# Patient Record
Sex: Female | Born: 1955 | Race: Black or African American | Hispanic: No | Marital: Married | Smoking: Former smoker
Health system: Southern US, Community
[De-identification: ages and names within clinical notes are randomized; demographics above are authoritative.]

## PROBLEM LIST (undated history)

## (undated) DIAGNOSIS — J8489 Other specified interstitial pulmonary diseases: Secondary | ICD-10-CM

## (undated) DIAGNOSIS — M329 Systemic lupus erythematosus, unspecified: Secondary | ICD-10-CM

## (undated) DIAGNOSIS — J449 Chronic obstructive pulmonary disease, unspecified: Secondary | ICD-10-CM

## (undated) DIAGNOSIS — I10 Essential (primary) hypertension: Secondary | ICD-10-CM

## (undated) DIAGNOSIS — E039 Hypothyroidism, unspecified: Secondary | ICD-10-CM

## (undated) DIAGNOSIS — N6001 Solitary cyst of right breast: Secondary | ICD-10-CM

## (undated) DIAGNOSIS — N6002 Solitary cyst of left breast: Secondary | ICD-10-CM

## (undated) DIAGNOSIS — I2510 Atherosclerotic heart disease of native coronary artery without angina pectoris: Secondary | ICD-10-CM

## (undated) DIAGNOSIS — M1990 Unspecified osteoarthritis, unspecified site: Secondary | ICD-10-CM

## (undated) HISTORY — PX: BREAST SURGERY: SHX581

## (undated) HISTORY — DX: Solitary cyst of right breast: N60.02

## (undated) HISTORY — DX: Solitary cyst of right breast: N60.01

## (undated) HISTORY — DX: Essential (primary) hypertension: I10

## (undated) HISTORY — DX: Other specified interstitial pulmonary diseases: J84.89

## (undated) HISTORY — PX: CARPAL TUNNEL RELEASE: SHX101

## (undated) HISTORY — DX: Systemic lupus erythematosus, unspecified: M32.9

## (undated) HISTORY — PX: TRIGGER FINGER RELEASE: SHX641

## (undated) HISTORY — DX: Atherosclerotic heart disease of native coronary artery without angina pectoris: I25.10

## (undated) HISTORY — PX: ABDOMINAL HYSTERECTOMY: SHX81

## (undated) HISTORY — PX: APPENDECTOMY: SHX54

## (undated) HISTORY — DX: Hypothyroidism, unspecified: E03.9

---

## 2001-09-10 ENCOUNTER — Ambulatory Visit (HOSPITAL_COMMUNITY): Admission: RE | Admit: 2001-09-10 | Discharge: 2001-09-10 | Payer: Self-pay | Admitting: *Deleted

## 2001-09-10 ENCOUNTER — Encounter: Payer: Self-pay | Admitting: *Deleted

## 2001-09-26 ENCOUNTER — Ambulatory Visit (HOSPITAL_COMMUNITY): Admission: RE | Admit: 2001-09-26 | Discharge: 2001-09-26 | Payer: Self-pay | Admitting: Family Medicine

## 2001-09-26 ENCOUNTER — Encounter: Payer: Self-pay | Admitting: *Deleted

## 2002-06-17 ENCOUNTER — Emergency Department (HOSPITAL_COMMUNITY): Admission: EM | Admit: 2002-06-17 | Discharge: 2002-06-17 | Payer: Self-pay | Admitting: *Deleted

## 2002-09-14 ENCOUNTER — Ambulatory Visit (HOSPITAL_COMMUNITY): Admission: RE | Admit: 2002-09-14 | Discharge: 2002-09-14 | Payer: Self-pay | Admitting: *Deleted

## 2002-09-14 ENCOUNTER — Encounter: Payer: Self-pay | Admitting: *Deleted

## 2003-01-05 ENCOUNTER — Inpatient Hospital Stay (HOSPITAL_COMMUNITY): Admission: RE | Admit: 2003-01-05 | Discharge: 2003-01-07 | Payer: Self-pay | Admitting: Obstetrics & Gynecology

## 2003-01-16 ENCOUNTER — Emergency Department (HOSPITAL_COMMUNITY): Admission: EM | Admit: 2003-01-16 | Discharge: 2003-01-16 | Payer: Self-pay | Admitting: Internal Medicine

## 2003-09-15 ENCOUNTER — Ambulatory Visit (HOSPITAL_COMMUNITY): Admission: RE | Admit: 2003-09-15 | Discharge: 2003-09-15 | Payer: Self-pay | Admitting: Family Medicine

## 2004-10-20 ENCOUNTER — Ambulatory Visit (HOSPITAL_COMMUNITY): Admission: RE | Admit: 2004-10-20 | Discharge: 2004-10-20 | Payer: Self-pay | Admitting: Family Medicine

## 2004-11-13 ENCOUNTER — Ambulatory Visit: Payer: Self-pay | Admitting: *Deleted

## 2005-03-26 ENCOUNTER — Ambulatory Visit (HOSPITAL_COMMUNITY): Admission: RE | Admit: 2005-03-26 | Discharge: 2005-03-26 | Payer: Self-pay | Admitting: Obstetrics & Gynecology

## 2005-05-01 ENCOUNTER — Encounter (HOSPITAL_COMMUNITY): Admission: RE | Admit: 2005-05-01 | Discharge: 2005-05-31 | Payer: Self-pay | Admitting: Endocrinology

## 2005-10-17 ENCOUNTER — Ambulatory Visit (HOSPITAL_COMMUNITY): Admission: RE | Admit: 2005-10-17 | Discharge: 2005-10-17 | Payer: Self-pay | Admitting: Family Medicine

## 2005-10-22 ENCOUNTER — Ambulatory Visit (HOSPITAL_COMMUNITY): Admission: RE | Admit: 2005-10-22 | Discharge: 2005-10-22 | Payer: Self-pay | Admitting: Family Medicine

## 2005-11-13 ENCOUNTER — Inpatient Hospital Stay (HOSPITAL_COMMUNITY): Admission: EM | Admit: 2005-11-13 | Discharge: 2005-11-22 | Payer: Self-pay | Admitting: *Deleted

## 2005-11-13 ENCOUNTER — Ambulatory Visit: Payer: Self-pay | Admitting: *Deleted

## 2005-11-13 ENCOUNTER — Encounter: Payer: Self-pay | Admitting: Internal Medicine

## 2005-11-14 ENCOUNTER — Encounter: Payer: Self-pay | Admitting: Cardiology

## 2005-11-14 ENCOUNTER — Ambulatory Visit: Payer: Self-pay | Admitting: Pulmonary Disease

## 2005-12-06 ENCOUNTER — Ambulatory Visit: Payer: Self-pay | Admitting: Cardiology

## 2006-10-24 ENCOUNTER — Ambulatory Visit (HOSPITAL_COMMUNITY): Admission: RE | Admit: 2006-10-24 | Discharge: 2006-10-24 | Payer: Self-pay | Admitting: Family Medicine

## 2006-11-06 ENCOUNTER — Encounter: Payer: Self-pay | Admitting: Orthopedic Surgery

## 2006-11-23 ENCOUNTER — Encounter: Payer: Self-pay | Admitting: Orthopedic Surgery

## 2006-12-24 ENCOUNTER — Encounter: Payer: Self-pay | Admitting: Orthopedic Surgery

## 2007-06-11 ENCOUNTER — Ambulatory Visit (HOSPITAL_COMMUNITY): Admission: RE | Admit: 2007-06-11 | Discharge: 2007-06-11 | Payer: Self-pay | Admitting: Obstetrics & Gynecology

## 2007-06-16 ENCOUNTER — Ambulatory Visit: Payer: Self-pay | Admitting: Internal Medicine

## 2007-07-07 ENCOUNTER — Ambulatory Visit (HOSPITAL_COMMUNITY): Admission: RE | Admit: 2007-07-07 | Discharge: 2007-07-07 | Payer: Self-pay | Admitting: Internal Medicine

## 2007-07-07 ENCOUNTER — Encounter: Payer: Self-pay | Admitting: Internal Medicine

## 2007-07-07 ENCOUNTER — Ambulatory Visit: Payer: Self-pay | Admitting: Internal Medicine

## 2007-11-03 ENCOUNTER — Ambulatory Visit (HOSPITAL_COMMUNITY): Admission: RE | Admit: 2007-11-03 | Discharge: 2007-11-03 | Payer: Self-pay | Admitting: Family Medicine

## 2008-08-01 ENCOUNTER — Emergency Department (HOSPITAL_COMMUNITY): Admission: EM | Admit: 2008-08-01 | Discharge: 2008-08-01 | Payer: Self-pay | Admitting: Emergency Medicine

## 2008-10-31 ENCOUNTER — Emergency Department (HOSPITAL_COMMUNITY): Admission: EM | Admit: 2008-10-31 | Discharge: 2008-10-31 | Payer: Self-pay | Admitting: Emergency Medicine

## 2008-11-19 ENCOUNTER — Ambulatory Visit (HOSPITAL_COMMUNITY): Admission: RE | Admit: 2008-11-19 | Discharge: 2008-11-19 | Payer: Self-pay | Admitting: Family Medicine

## 2009-03-19 ENCOUNTER — Emergency Department (HOSPITAL_COMMUNITY): Admission: EM | Admit: 2009-03-19 | Discharge: 2009-03-19 | Payer: Self-pay | Admitting: Emergency Medicine

## 2009-07-15 ENCOUNTER — Ambulatory Visit (HOSPITAL_COMMUNITY): Admission: RE | Admit: 2009-07-15 | Discharge: 2009-07-15 | Payer: Self-pay | Admitting: Cardiology

## 2009-07-15 ENCOUNTER — Encounter: Payer: Self-pay | Admitting: Cardiology

## 2009-07-15 ENCOUNTER — Ambulatory Visit: Payer: Self-pay | Admitting: Cardiology

## 2009-07-22 DIAGNOSIS — R0602 Shortness of breath: Secondary | ICD-10-CM

## 2009-07-26 ENCOUNTER — Encounter (INDEPENDENT_AMBULATORY_CARE_PROVIDER_SITE_OTHER): Payer: Self-pay | Admitting: *Deleted

## 2009-07-29 ENCOUNTER — Ambulatory Visit (HOSPITAL_COMMUNITY): Admission: RE | Admit: 2009-07-29 | Discharge: 2009-07-29 | Payer: Self-pay | Admitting: Cardiology

## 2009-07-29 ENCOUNTER — Ambulatory Visit: Payer: Self-pay | Admitting: Cardiology

## 2009-07-29 DIAGNOSIS — IMO0001 Reserved for inherently not codable concepts without codable children: Secondary | ICD-10-CM

## 2009-07-29 DIAGNOSIS — R9431 Abnormal electrocardiogram [ECG] [EKG]: Secondary | ICD-10-CM

## 2009-07-29 DIAGNOSIS — I10 Essential (primary) hypertension: Secondary | ICD-10-CM

## 2009-07-29 DIAGNOSIS — I2510 Atherosclerotic heart disease of native coronary artery without angina pectoris: Secondary | ICD-10-CM

## 2009-08-01 ENCOUNTER — Encounter: Payer: Self-pay | Admitting: Cardiology

## 2009-08-04 ENCOUNTER — Encounter: Payer: Self-pay | Admitting: Cardiology

## 2009-08-05 LAB — CONVERTED CEMR LAB
ALT: 17 units/L (ref 0–35)
AST: 16 units/L (ref 0–37)
Bilirubin, Direct: 0.1 mg/dL (ref 0.0–0.3)
Cholesterol: 189 mg/dL (ref 0–200)
Indirect Bilirubin: 0.3 mg/dL (ref 0.0–0.9)
Total Bilirubin: 0.4 mg/dL (ref 0.3–1.2)
Total CHOL/HDL Ratio: 4.1
Total Protein: 6.7 g/dL (ref 6.0–8.3)
VLDL: 20 mg/dL (ref 0–40)

## 2009-08-08 ENCOUNTER — Encounter: Payer: Self-pay | Admitting: Cardiology

## 2009-08-08 DIAGNOSIS — E782 Mixed hyperlipidemia: Secondary | ICD-10-CM

## 2009-08-09 ENCOUNTER — Encounter: Payer: Self-pay | Admitting: Cardiology

## 2009-08-09 ENCOUNTER — Encounter (HOSPITAL_COMMUNITY): Admission: RE | Admit: 2009-08-09 | Discharge: 2009-09-08 | Payer: Self-pay | Admitting: Cardiology

## 2009-08-09 ENCOUNTER — Ambulatory Visit: Payer: Self-pay | Admitting: Cardiology

## 2009-08-26 ENCOUNTER — Ambulatory Visit: Payer: Self-pay | Admitting: Cardiology

## 2009-12-09 ENCOUNTER — Ambulatory Visit (HOSPITAL_COMMUNITY): Admission: RE | Admit: 2009-12-09 | Discharge: 2009-12-09 | Payer: Self-pay | Admitting: Family Medicine

## 2010-02-01 ENCOUNTER — Ambulatory Visit: Payer: Self-pay | Admitting: Cardiology

## 2010-02-02 ENCOUNTER — Encounter: Payer: Self-pay | Admitting: Cardiology

## 2010-09-24 ENCOUNTER — Emergency Department (HOSPITAL_COMMUNITY)
Admission: EM | Admit: 2010-09-24 | Discharge: 2010-09-24 | Payer: Self-pay | Source: Home / Self Care | Admitting: Emergency Medicine

## 2010-10-24 NOTE — Letter (Signed)
Summary: EKG  5.6.11  EKG  5.6.11   Imported By: Faythe Ghee 02/02/2010 12:08:48  _____________________________________________________________________  External Attachment:    Type:   Image     Comment:   External Document

## 2010-10-24 NOTE — Letter (Signed)
Summary: PROGRESS NOTE [facility] 5.6.11  PROGRESS NOTE [facility] 5.6.11   Imported By: Faythe Ghee 02/02/2010 12:08:14  _____________________________________________________________________  External Attachment:    Type:   Image     Comment:   External Document

## 2010-10-24 NOTE — Assessment & Plan Note (Signed)
Summary: **6 MTH F/U PER CHECKOUT ON 08/26/09/TG   Visit Type:  Follow-up Primary Provider:  Dr. Reynolds Bowl   History of Present Illness: 55 year old woman presents for followup. She is being considered for elective right carpal tunnel release, scheduled for next Thursday. We are asked to see her from a preoperative perspective. She denies any problems with exertional chest pain or breathlessness. I reviewed her history, including a followup Lexiscan Myoview done in November 2010 which revealed no ischemia with an LVEF of 65%  She reports compliance with her medications, and blood pressure is well controlled today..  Current Medications (verified): 1)  Estradiol 1 Mg Tabs (Estradiol) .... Take 1 Tab Daily 2)  Lovastatin 40 Mg Tabs (Lovastatin) .... Take 1 Tablet By Mouth Once Daily 3)  Advair Hfa 230-21 Mcg/act Aero (Fluticasone-Salmeterol) .... Use Two Times A Day 4)  Levothyroxine Sodium 88 Mcg Tabs (Levothyroxine Sodium) .... Take 1 Tab Daily 5)  Vasotec 2.5 Mg Tabs (Enalapril Maleate) .... Take 1 Tab Daily 6)  Aspir-Low 81 Mg Tbec (Aspirin) .... 5take 1 Tab Daily 7)  Coreg 6.25 Mg Tabs (Carvedilol) .... Take 1 Tab Two Times A Day 8)  Imdur 30 Mg Xr24h-Tab (Isosorbide Mononitrate) .... Take 1/2 Tablet By Mouth Once Daily 9)  Meloxicam 7.5 Mg Tabs (Meloxicam) .... Take 1 Tab As Needed 10)  Plaquenil 200 Mg Tabs (Hydroxychloroquine Sulfate) .... Take 1 Tab Two Times A Day  Allergies (verified): 1)  ! Levaquin 2)  ! * Cipron 3)  ! Sulfa 4)  ! Thiazides 5)  ! * Loop Diurectics 6)  ! 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    Alcohol Use - no Regular Exercise - no  Clinical Review Panels:  Echocardiogram Echocardiogram  Study Conclusions    1. Left ventricle: The cavity size was normal. Wall thickness was      increased in a pattern of mild LVH. Systolic function was normal.      The estimated ejection fraction was in the range of 55% to 60%.      Wall motion was normal; there were no regional wall motion      abnormalities.   2. Right ventricle: The cavity size was at the upper limits of      normal. Wall thickness was borderline.      .   Impressions:    - Compared to the prior study of 11/14/05, there has been no     significant interval change. (07/15/2009)    Review of Systems  The patient denies fever, chest pain, syncope, dyspnea on exertion, peripheral edema, prolonged cough, melena, and hematochezia.         Otherwise reviewed and negative.  Vital Signs:  Patient profile:   55 year old female Weight:      182 pounds Pulse rate:   69 / minute BP sitting:   128 / 80  (right arm)  Vitals Entered By: Dreama Saa, CNA (Feb 01, 2010 2:55 PM)  Physical Exam  Additional Exam:  Overweight woman in no acute distress. HEENT: Conjunctiva and lids normal, oropharynx clear with poor dentition. Neck: Supple, no elevated jugular venous pressure, no carotid bruits, no thyromegaly. Lungs: Clear with diminished breath sounds, nonlabored, no wheezing. Cardiac:  Regular rate and rhythm, soft basal systolic murmur, normal second heart sound, no S3 gallop.  Abdomen: Soft, nontender, no hepatomegaly, no bruits. Extremities: Trace ankle edema. Distal pulses one plus. Skin: Warm and dry. Musculoskeletal: No kyphosis. Neuropsychiatric: Alert and oriented x3, affect somewhat flat.   Nuclear Study  Procedure date:  08/09/2009  Findings:      IMPRESSION: Overall normal Lexiscan Myoview as outlined.  Resting ST-segment abnormalities are significant, and chronically noted.  There is no diagnostic  accentuation in the ST-segment abnormalities following Lexiscan infusion, and no chest pain was reported.  Perfusion data show evidence of breast attenuation but no ischemia.  There is a normal left ventricular ejection fraction of 65% without focal wall motion abnomalities.  Impression & Recommendations:  Problem # 1:  PREOPERATIVE EXAMINATION (ICD-V72.84)  Symptomatically stable, specifically with no progressive shortness of breath or chest pain, with prior history of nonobstructive CAD at catheterization in 2007, followed more recently by a Lexiscan Myoview in November 2010 revealing no ischemia with LVEF of 65%. She is on a reasonable medical regimen. She should be able to proceed with elective right carpal tunnel release at an acceptable perioperative cardiovascular risk. No further testing indicated at this time.  Problem # 2:  HYPERLIPIDEMIA (ICD-272.4)  Patient continues on lovastatin. She should continue follow up with her primary care provider, aiming for an LDL control close to 70.  Her updated medication list for this problem includes:    Lovastatin 40 Mg Tabs (Lovastatin) .Marland Kitchen... Take 1 tablet by mouth once daily  Problem # 3:  CORONARY ATHEROSCLEROSIS NATIVE CORONARY ARTERY (ICD-414.01)  Nonobstructive based on cardiac catheterization 2007, with followup reassuring noninvasive testing in November 2010 as outlined. Medical therapy anticipated.  Her updated medication list for this problem includes:    Vasotec 2.5 Mg Tabs (Enalapril maleate) .Marland Kitchen... Take 1 tab daily    Aspir-low 81 Mg Tbec (Aspirin) .Marland Kitchen... 5take 1 tab daily    Coreg 6.25 Mg Tabs (Carvedilol) .Marland Kitchen... Take 1 tab two times a day    Imdur 30 Mg Xr24h-tab (Isosorbide mononitrate) .Marland Kitchen... Take 1/2 tablet by mouth once daily  Patient Instructions: 1)  Your physician recommends that you schedule a follow-up appointment in: 1 year  Appended Document: **6 MTH F/U PER CHECKOUT ON 08/26/09/TG faxed office note to [facility]  attention Gae Gallop surgical coordinator at [phone]

## 2010-11-22 ENCOUNTER — Emergency Department (HOSPITAL_COMMUNITY)
Admission: EM | Admit: 2010-11-22 | Discharge: 2010-11-23 | Disposition: A | Discharge status: Home / Self Care | Payer: PRIVATE HEALTH INSURANCE | Attending: Emergency Medicine | Admitting: Emergency Medicine

## 2010-11-22 DIAGNOSIS — Z79899 Other long term (current) drug therapy: Secondary | ICD-10-CM | POA: Insufficient documentation

## 2010-11-22 DIAGNOSIS — E039 Hypothyroidism, unspecified: Secondary | ICD-10-CM | POA: Insufficient documentation

## 2010-11-22 DIAGNOSIS — J449 Chronic obstructive pulmonary disease, unspecified: Secondary | ICD-10-CM | POA: Insufficient documentation

## 2010-11-22 DIAGNOSIS — I10 Essential (primary) hypertension: Secondary | ICD-10-CM | POA: Insufficient documentation

## 2010-11-22 DIAGNOSIS — J4489 Other specified chronic obstructive pulmonary disease: Secondary | ICD-10-CM | POA: Insufficient documentation

## 2010-11-22 DIAGNOSIS — Z7982 Long term (current) use of aspirin: Secondary | ICD-10-CM | POA: Insufficient documentation

## 2010-11-22 DIAGNOSIS — E7800 Pure hypercholesterolemia, unspecified: Secondary | ICD-10-CM | POA: Insufficient documentation

## 2010-11-22 DIAGNOSIS — R51 Headache: Secondary | ICD-10-CM | POA: Insufficient documentation

## 2010-11-23 ENCOUNTER — Emergency Department (HOSPITAL_COMMUNITY): Payer: PRIVATE HEALTH INSURANCE

## 2010-11-28 ENCOUNTER — Other Ambulatory Visit (HOSPITAL_COMMUNITY): Payer: Self-pay | Admitting: Family Medicine

## 2010-11-28 DIAGNOSIS — Z139 Encounter for screening, unspecified: Secondary | ICD-10-CM

## 2010-12-12 ENCOUNTER — Ambulatory Visit (HOSPITAL_COMMUNITY)
Admission: RE | Admit: 2010-12-12 | Discharge: 2010-12-12 | Disposition: A | Discharge status: Home / Self Care | Payer: PRIVATE HEALTH INSURANCE | Source: Ambulatory Visit | Attending: Family Medicine | Admitting: Family Medicine

## 2010-12-12 DIAGNOSIS — Z139 Encounter for screening, unspecified: Secondary | ICD-10-CM

## 2010-12-12 DIAGNOSIS — Z1231 Encounter for screening mammogram for malignant neoplasm of breast: Secondary | ICD-10-CM | POA: Insufficient documentation

## 2011-02-06 NOTE — Consult Note (Signed)
NAME:  Alicia Peters, Alicia Peters               ACCOUNT NO.:  [id number]   MEDICAL RECORD NO.:  [id number]          PATIENT TYPE:  AMB   LOCATION:  DAY                           FACILITY:  [facility]   PHYSICIAN:  R. Roetta Sessions, M.D. DATE OF BIRTH:  December 28, 1955   DATE OF CONSULTATION:  DATE OF DISCHARGE:                                 CONSULTATION   REQUESTING PHYSICIAN:  Dr. Despina Hidden.   REASON FOR CONSULTATION:  Chronic constipation.   HISTORY OF PRESENT ILLNESS:  Alicia Peters is a 55 year old African-  American female.  She has a history of chronic constipation.  She tells  me she has approximately two bowel movements per week.  She usually has  to drink prune juice to bring on her bowel movement.  She denies any  history of laxative use.  She has laxative use on a regular basis.  She  did try MiraLax for 1 month, which did not seem to help.  She has  increased the fiber in her diet but has not tried fiber supplementation.  She denies any rectal bleeding or melena.  She does have an intermittent  left lower quadrant, sharp and low abdominal pain which is sharp and  lasts a few seconds.  Most of her pain is relieved post defecation.  She  has never had a colonoscopy.  She has occasional heartburn and  indigestion, depending on which eats a couple of times per week.  She  has tried [employer] which seem to help.  She has never been on PPI.  She  denies any dysphagia or odynophagia, denies anorexia or early satiety.  She had her TSH checked recently, and she goes this week for results.  She is on Levoxyl.   PAST MEDICAL/SURGICAL HISTORY:  Hypertension, asthma,  hypercholesterolemia and hypothyroidism.  She had a benign left and  right breast cystectomy or cyst biopsies. She has had hand surgery and a  complete hysterectomy.   CURRENT MEDICATIONS:  1. Estradiol 2 mg daily.  2. Advair 250/50 mg b.i.d.  3. Aspirin 81 mg daily.  4. Isosorbide 30 mg daily.  5. Lovastatin 20 mg q.h.s.  6. Enalapril 2-1/2  mg b.i.d.  7. Carvedilol 6.25 mg b.i.d.  8. Levoxyl 88 mcg daily.   ALLERGIES:  SULFA, CIPRO, THIAZIDE, LOOP DIURETICS AND SULFA AND UREAS.   FAMILY HISTORY:  There is no known family history of colorectal  carcinoma or liver or chronic GI problems.   SOCIAL HISTORY:  Alicia Peters is married.  She has two children ages 38  and 30.  Both of them are healthy.  She is employed and works third  shifts in Designer, fashion/clothing.  She has a 40 pack-year history of tobacco use, quit  in 2007.  Denies any alcohol or drug use.   REVIEW OF SYSTEMS:  See HPI.  CONSTITUTIONAL:  Her weight has remained  stable.  Denies any fever or chills.  Otherwise negative.   PHYSICAL EXAM:  VITAL SIGNS:  Weight 181, height 54 inches,  temperature  97.9, blood pressure 140/90 and pulse 62.  GENERAL:  Alicia Peters is a well-developed,  well-nourished African-  American female in no acute distress.  HEENT.  Clear nonicteric, conjunctivae pink.  Oropharynx pink and moist  without any lesions.  NECK:  Supple without any masses or thyromegaly.  CHEST/HEART:  Regular rate and rhythm, normal S1, S2  without murmurs,  clicks, rubs or gallops.  LUNGS:  Clear auscultation bilaterally.  ABDOMEN:  Protuberant with positive bowel sounds x4.  No bruits  auscultated.  Soft, nontender, nondistended without palpable mass or  hepatosplenomegaly.  No rebound tenderness guarding.  RECTAL:  Deferred.  EXTREMITIES:  Without clubbing or edema bilaterally.  SKIN:  Brown, warm and dry, without rashes or jaundice.   IMPRESSION:  Ms. Pallett is a 55 year old African-American female who  will turn 51 next month.  At that time, she will need screening  colonoscopy.  She also has a history of chronic constipation for the  last 3 years, status post hysterectomy.  I feel she may benefit from  Amitiza, and we will go ahead and get her set up for screening  colonoscopy.   PLAN:  1. Constipation literature given for her review and standard       guidelines discussed today.  2. Colonoscopy with Dr. Jena Gauss in the near future.  Discussed      procedure, including risks and benefits included but not limited to      bleeding, infection, perforation, drug reaction, and agrees with      the plan.   PLAN:  1. No aspirin or __________  prior to the exam.  2. Suggested daily fiber supplementation of choice.  3. Increase her daily fluids.   We would like Dr. Despina Hidden for allowing [place] to participate in the care of Ms.  Peters.      Lorenza Burton, N.P.      Jonathon Bellows, M.D.  Electronically Signed    KJ/MEDQ  D:  06/16/2007  T:  06/16/2007  Job:  [id number]   cc:   Lazaro Arms, M.D.  Fax: [phone]

## 2011-02-06 NOTE — Op Note (Signed)
NAME:  Alicia Peters, Alicia Peters               ACCOUNT NO.:  [id number]   MEDICAL RECORD NO.:  [id number]          PATIENT TYPE:  AMB   LOCATION:  DAY                           FACILITY:  [facility]   PHYSICIAN:  R. Roetta Sessions, M.D. DATE OF BIRTH:  [birth year]   DATE OF PROCEDURE:  07/07/2007  DATE OF DISCHARGE:                               OPERATIVE REPORT   OPERATION/PROCEDURE:  Colonoscopy and biopsy.   INDICATIONS FOR PROCEDURE:  A 55 year old [place] American female with  chronic constipation refractory to MiraLax.  Has not had any rectal  bleeding.  Has never had her  GI tract imaged. No family history.  Colorectal placed.  Colonoscopy is now being done.  This approach has  been  discussed with the patient at length.  Potential risks and  benefits have been reviewed and questions answered.  She is agreeable.  Please see documentation in the medical record.   PROCEDURE NOTE:  Oxygen saturation, blood pressure, pulse, respirations,  and monitor throughout entire procedure.  Conscious sedation with Versed  3 mg IV, Demerol 75 mg IV in divided doses.  Instrument was Pentax video  chip system.   FINDINGS:  Digital rectal exam revealed no abnormalities.  The prep was  suboptimal to marginal.  There was some slimy coating of stool  interspersed throughout the entire colon which made exam more difficult  as it would not wash off very easily.  The colonic mucosa was examined  from the rectosigmoid junction through the left, transverse, right colon  to the appendiceal orifice, ileocecal valve, and cecum.  These  structures well seen and  photographed for the record.  From this level,  the scope was slowly withdrawn.  Previously mentioned mucosal surfaces  were again seen.  The patient had a cluster of 1-3 mm diminutive polyps  at the splenic flexure.  The remainder of the colonic mucosa appeared  normal although the poor prep compromised the exam.  These polyps were  cold biopsy/removed.  The scope  was pulled down to the rectum.  Thorough  exam of the remainder of the rectal mucosa and retroflex view of the  anal verge demonstrated no abnormalities.  The patient tolerated the  procedure well and was reactive after endoscopy.   IMPRESSION:  1. Normal rectum.  2. Diminutive polyps at the  splenic flexure cold biopsy/removed.  The      remainder of the colonic mucosa appeared normal.  3. Suboptimal prep compromised exam.  Very small lesion may not have      been seen because of obscuration with residual stool.   RECOMMENDATIONS:  1. Follow-up on path.  2. Begin Amitiza 8 mcg one tablet during breakfast daily for 5 days,      then increase to twice daily with meals.  He was warned about      diarrhea and nausea as main side effects.  3. Plan to see this nice lady back in one month to assess her      progress.  .      R. Roetta Sessions, M.D.  Electronically Signed  RMR/MEDQ  D:  07/07/2007  T:  07/08/2007  Job:  [id number]   cc:   Lazaro Arms, M.D.  Fax: [phone]

## 2011-02-09 NOTE — Cardiovascular Report (Addendum)
NAME:  Alicia Peters, Alicia Peters NO.:  [id number]   MEDICAL RECORD NO.:  [id number]          PATIENT TYPE:  INP   LOCATION:  2901                         FACILITY:  [facility]   PHYSICIAN:  Arturo Morton. Riley Kill, M.D. [facility] OF BIRTH:  1956/04/01   DATE OF PROCEDURE:  11/21/2005  DATE OF DISCHARGE:                              CARDIAC CATHETERIZATION   ____ QA MARKER: 15#1:Up Training                                   INDICATIONS:  Ms. Word is a 55 year old female who presents with acute  respiratory failure. 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 She had an abnormal EKG on presentation. the current study  was done to assess coronary anatomy and measure right heart pressures. The  risks, benefits and alternatives were discussed with the patient on the day  prior to the procedure and she was brought to the laboratory with fully  informed consent.   PROCEDURES:  1.  Right and left heart catheterization  2.  Selective coronary arteriography.  3.  Selective left ventriculography.   DESCRIPTION OF PROCEDURE:  The patient was brought to the catheterization  laboratory and prepped and draped in the usual fashion. We used a Smart  needle and were able to enter the right femoral vein with a first puncture.  A #7 French sheath was then placed. Following this, saturations were  obtained in the superior vena cava. Sequential right heart pressures were  obtained. Pulmonary artery saturation was obtained. Thermodilution cardiac  outputs were then performed. Following this, the Smart needle was also used  to enter the right femoral artery on the first puncture and an anterior  puncture. A #6 French sheath was placed. Central aortic and left ventricular  pressures were measured with a pigtail catheter. Simultaneous wedge LV were  obtained. Saturation was obtained through the arterial catheter as well.  Simultaneous LV/RV pressures were then obtained.  Ventriculography was then  performed in the RAO projection. Overall systolic function was moderately  reduced in a global fashion. The pigtail catheter was subsequently removed.  Coronary arteriography was then performed without complication. The patient  was subsequently taken into the holding area. Because of her asthma, she was  not administered labetalol for her hypertension. We did give 2 doses of  Vasotec, and also started her on nitroglycerin drip with some improvement in  the control of the blood pressure.   HEMODYNAMIC DATA:  1.  Central aorta 186/99, mean 135.  2.  Left ventricle 178/14.  3.  Right atrium 9-10.  4.  RV 35/10.  5.  Pulmonary artery 35/20.  6.  Pulmonary capillary wedge 13.  7.  Superior vena cava saturation 73%.  8.  Pulmonary artery saturation 74%.  9.  Aortic saturation 98% on 2 liters.  10. Fick cardiac output 3.9 liters per minute.  11. Fick cardiac index 2.2 liters per minute per meter squared.  12. Thermodilution cardiac output 4.2 liters per minute.  13. Thermodilution cardiac index 2.4 liters per minute per meter squared.   ANGIOGRAPHIC DATA:  1.  Ventriculography was done in the RAO projection. There did not appear to      be significant mitral regurgitation. There was moderate global      hypokinesis. Ejection fraction was calculated at 49%.  2.  The left main was a large caliber vessel that was free of critical      disease.  3.  The LAD coursed to the apex, provided the apical vessel and also      basically the posterior descending distribution as well. The LAD has a      fair amount of mild luminal irregularity without any evidence of high      grade stenosis.  4.  The circumflex is a moderately large vessel. There is a first marginal      branch that has segmental 30% plaquing proximally. The distal vessel is      without critical narrowing. The AV portion of the circumflex appears to      be diffusely narrow, although focal stenosis is not  noted. There is      about 50% to 60% narrowing at the takeoff of a small marginal branch.  5.  The right coronary artery is a non-dominant vessel.   CONCLUSIONS:  1.  Systemic hypertension.  2.  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  3.  Mild pulmonary hypertension.  4.  Hypoxemic respiratory failure, possibly be related to bronchiolitis      obliterans.   DISPOSITION:  1.  We will add nitrates to the patient's regimen.  2.  She will need to be ambulated.  3.  Control of hypertension will be important.  4.  Hopefully, she can be weaned off her prednisone.  5.  We may need to add ACE inhibitors or angiotensin receptor blockers to      her regimen.  6.  We will also add nitrates.      Arturo Morton. Riley Kill, M.D. [facility]  Electronically Signed     TDS/MEDQ  D:  11/21/2005  T:  11/21/2005  Job:  [id number]   cc:   Vida Roller, M.D.  Fax: [phone]   Marcelyn Bruins, M.D. [facility]  [address]  [place]  [place] [address]

## 2011-02-09 NOTE — Op Note (Signed)
NAME:  Alicia Peters, Alicia Peters                         ACCOUNT NO.:  [id number]   MEDICAL RECORD NO.:  [id number]                   PATIENT TYPE:  AMB   LOCATION:  DAY                                  FACILITY:   PHYSICIAN:  Lazaro Arms, M.D.                DATE OF BIRTH:  1955-10-29   DATE OF PROCEDURE:  01/05/2003  DATE OF DISCHARGE:                                 OPERATIVE REPORT   PREOPERATIVE DIAGNOSES:  1. Menometrorrhagia.  2. Dysmenorrhea.  3. Enlarged fibroid uterus.   POSTOPERATIVE DIAGNOSES:  1. Menometrorrhagia.  2. Dysmenorrhea.  3. Enlarged fibroid uterus.  4. Densely adherent ovaries bilaterally to the uterus with minimal     endometriosis of the right ovary.  5. Small ovaries bilaterally consistent with impending menopause.   PROCEDURE:  1. Total abdominal hysterectomy.  2. Bilateral salpingo-oophorectomy.   SURGEON:  Lazaro Arms, M.D.   ANESTHESIA:  General endotracheal.   FINDINGS:  The patient had multiple fibroids again confirmed large one  posteriorly.  Her ovaries were densely adherent to the side of the uterus  bilaterally. I do not know whether that occurred at the time of tubal  ligation.  She was status post tubal ligation, that was obvious, but she did  have some endometriosis of the right ovary.  There was no other  endometriosis seen. There were no other abnormalities of the peritoneal  cavity identified.   DESCRIPTION OF PROCEDURE:  The patient was taken to the operating room and  placed in the supine position where she underwent general endotracheal  anesthesia.  She was then frog-legged.  The urethra was prepped and a Foley  catheter was placed; and the vagina was prepped as well.  The abdomen was  prepped and draped in the usual sterile fashion.  A Pfannenstiel skin  incision was made and carried down sharply through the rectus fascia which  was scored in the midline and extended laterally.  The fascia was taken off  the muscles  superiorly and inferiorly without difficulty.  The muscles were  divided.  The peritoneal cavity was entered.  A medium sized protractor was  placed and the upper abdomen was packed away and the patient was placed in  Trendelenburg position.  The uterus was grasped bilaterally at the cornu.  The left round ligament was suture ligated and cut.  There was no way to  dissect the ovary off of the uterus without interrupting the blood supply.  The ovary was densely adherent.  It was also quite small amount the size of  a thumbnail.  As a result an avascular window was found cephalad to the  ovary and the infundibulopelvic ligament and it was clamped, cut, and double  suture ligated.  The right round ligament was then isolated.  It was suture  ligated and cut and the same was true of the right ovary to the uterus.  It  was densely adherent and there was no way to dissect it out without  interrupting the blood supply.  As a result, the infundibulopelvic ligament  was clamped, cut and double suture ligated with good hemostasis.   The vesicouterine serosal flap was created and it was pushed down off the  lower uterine segment.  The uterine vessels were skeletonized.  Each uterine  vessel was clamped, cut, and suture ligated with good hemostasis.  Serial  pedicles were taken down the cervix through the cardinal ligament with each  pedicle being clamped, cut, and suture ligated.  The vagina was then cross-  clamped and the specimen was removed.  Vaginal angle sutures were placed and  the vagina was closed with interrupted figure-of-eight sutures.   The pelvis was irrigated vigorously and found to be hemostatic.  All  pedicles were hemostatic. The protractor was then removed and the lap packs  were removed.  The muscles of the peritoneum were closed loosely using #0  Vicryl and the fascia was closed using #0 Vicryl in a running fashion.  The  subcutaneous tissue was made hemostatic and irrigated; and  subcutaneous  sutures were placed; additionally subcuticular sutures were placed.  Dermabond was used to close the skin edges. The patient tolerated the  procedure well.  She experienced 150 cc of blood loss and was taken to the  recovery room in good stable condition.  All counts were correct x3.  All  specimens went to the lab and she received Levaquin prophylactically.                                               Lazaro Arms, M.D.    Loraine Maple  D:  01/05/2003  T:  01/05/2003  Job:  [id number]

## 2011-02-09 NOTE — H&P (Signed)
NAME:  Alicia Peters, Alicia Peters NO.:  [id number]   MEDICAL RECORD NO.:  [id number]          PATIENT TYPE:  EMS   LOCATION:  MAJO                         FACILITY:  [facility]   PHYSICIAN:  Vida Roller, M.D.   DATE OF BIRTH:  06/13/1956   DATE OF ADMISSION:  11/13/2005  DATE OF DISCHARGE:                                HISTORY & PHYSICAL   PRIMARY:  Dr. Jorene Guest   REFERRING PHYSICIAN:  Dr. Lonia Blood of the hospitalist group at [facility].   HISTORY OF PRESENT ILLNESS:  Alicia Peters is a 55 year old African-American  female who presented to the ER at [facility] with a week of progressive  shortness of breath. While in the ER, she deteriorated to the point of  respiratory failure and was intubated. She was unable to give a history to  me but this history was obtained from the referring physician. She has a  history of reactive airways disease with ongoing tobacco abuse and appears  to have had a severe respiratory episode which necessitated the aggressive  treatment. We were asked to see her because of profound ST-segment  depression with sinus tachycardia in the setting of respiratory failure.   PAST MEDICAL HISTORY:  Significant for asthma, hypertension, and ongoing  tobacco abuse. She also has a history of a bilateral tubal ligation, a  TAH/BSO. The tubal ligation was done in 1993, the TAH/BSO was done in 2004.  She has also had an appendectomy. She lives in [place] with her husband.  She is married. She has two children. She smokes. It is unknown whether she  drinks or uses illicit drugs. She is allergic to SULFA, [facility] and she  thinks she is allergic to CIPRO according to her record.   She is also on:  1.  Levothyroxine 88 mcg once a day. There is no history of hypothyroidism.  2.  She is on estradiol 2 mg once a day.  3.  She is on lovastatin 20 mg once a day.  4.  Atenolol 25 mg once a day.  5.  Lisinopril/hydrochlorothiazide 10 and 12.5 once a day.  6.   Albuterol and Advair nebulizers.   I was not able to get a family history or review of systems due to the  patient's sedated and intubated state.   PHYSICAL EXAMINATION:  VITAL SIGNS:  She is afebrile. Her pulse is about  120, her blood pressure was 210/110.  GENERAL:  She is intubated and sedated. She is a well-developed, well-  nourished black female.  HEENT:  Unremarkable.  NECK:  She was using accessory muscles of respiration but no obvious bruits  are noted.  HEART:  Tachycardiac with no murmur.  LUNGS:  Have decreased breath sounds with almost no air movement throughout.  SKIN:  Without rashes.  GENITOURINARY, BREAST AND RECTAL:  Deferred.  ABDOMEN:  Mildly obese with normoactive bowel sounds. No hepatosplenomegaly.  LOWER EXTREMITIES:  Without edema, 2+ pulses throughout.  MUSCULOSKELETAL AND NEUROLOGIC:  Grossly nonfocal.   Chest x-ray shows peribronchial thickening with a right upper lobe  atelectasis but no congestive heart failure.  Electrocardiogram is very  concerning. Sinus tachycardia, rate of 129 with normal intervals and normal  axes but 3-4 mm of ST-segment depression in the inferior and anterior  lateral leads. She has some left ventricular hypertrophy.   LABORATORY:  White blood cell count 15.7, H&H of 14 and 42, platelet count  399. Sodium 135, potassium 3.1, chloride 102, bicarb 22, BUN 11, creatinine  0.9, and her blood sugar is 153. First set of cardiac enzymes is reassuring:  Total CK of 91 with an MB of 0.2, troponin of 0.02. Blood gas on  supplemental oxygen at 100% shows a pH of 7.42, pCO2 of 33, pO2 of 63, 92%  saturation. A quick-look echocardiogram shows a hyperdynamic left ventricle  with no obvious wall motion abnormalities and normal size right ventricle  with no obvious valvular heart disease.   ASSESSMENT:  1.  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  2.  What appears to be a  community-acquired pneumonia in the upper lobe,      which may be the precipitating factor.  3.  Ongoing tobacco abuse.  4.  Profound ST-segment depression with tachycardia, very concerning for      concomitant coronary artery disease.  5.  Hypokalemia, which is likely secondary to her inhalers  6.  Ventilator-dependent respiratory failure, likely secondary to #1.  7.  Hypertension which is severe, with hypertensive emergency.   Our plan is to transport this patient emergently to the intensive care unit  [facility], to aggressively treat her blood pressure with IV  nitroglycerin, cycle her enzymes, get a pulmonary critical care consult, and  eventually when she is stabilized she will need a right and left heart  catheterization to exclude significant coronary disease and severe reactive  airways disease/pulmonary hypertension. I think she probably needs to stop  smoking. We should start her on aspirin, consider anticoagulating her with  Lovenox, and aggressively treat her risk factors.      Vida Roller, M.D.  Electronically Signed     JH/MEDQ  D:  11/13/2005  T:  11/13/2005  Job:  [id number]

## 2011-02-09 NOTE — Discharge Summary (Signed)
NAME:  Alicia Peters, Alicia Peters NO.:  [id number]   MEDICAL RECORD NO.:  [id number]          PATIENT TYPE:  INP   LOCATION:  2901                         FACILITY:  [facility]   PHYSICIAN:  Vida Roller, M.D.   DATE OF BIRTH:  1956-07-28   DATE OF ADMISSION:  11/13/2005  DATE OF DISCHARGE:  11/22/2005                                 DISCHARGE SUMMARY   CHIEF COMPLAINT:  Shortness of breath.   SECONDARY DIAGNOSES:  1.  Ventilator dependent respiratory failure.  2.  Left ventricular dysfunction with an ejection fraction of 49% at      catheterization.  3.  Nonobstructive coronary artery disease with a 30% left anterior      descending and a 50% to 60% obtuse marginal with diffuse disease in the      circumflex at catheterization this admission.  4.  BOOP.  5.  Hypokalemia.  6.  Urine drug screen positive for benzodiazepines, cannabinoids and      opiates.  7.  Possible urinary tract infection on Pyridium, urine culture showing 3000      colonies.  8.  Dyslipidemia with total cholesterol 205, triglycerides 91, HDL 49, LDL      138.  9.  ALLERGY OR INTOLERANCE TO PENICILLIN, LEVAQUIN AND CIPRO.  10. Hypothyroidism.   PROCEDURES:  1.  Mechanical ventilation.  2.  Cardiac catheterization.  3.  Coronary arteriogram.  4.  Left ventriculogram.   HOSPITAL COURSE:  Alicia Peters is a 55 year old female with known history of  coronary artery disease. She presented to the ER at [facility] with a week  of progressive shortness of breath. Her condition deteriorated in the  emergency room and she was intubated. She had profound ST segment depression  with sinus tachycardia in the setting of respiratory failure. She was  transferred from [facility] to [facility] for further evaluation and  treatment.   A pulmonary consult was called to assist in managing her ventilation. It was  felt that she had BOOP as well as congestive heart failure and pulmonary  edema. She was diuresed and  placed on steroids. By November 16, 2005 she was  extubated. She did not require further mechanical ventilation and her  respiratory status improved. At discharge she is to continue Advair and  Albuterol p.r.n. She is to follow-up with Dr. Jayme Cloud after discharge.   It was felt that she needed further evaluation of her cardiac status as her  CK MB's were elevated with a normal index, but troponins were negative. She  had a right and left heart cath on November 21, 2005.   The cardiac catheterization showed no critical coronary artery disease.  There was a 50% to 60% stenosis in the OM and the circumflex and diffuse  disease with a 30% stenosis in the LAD. Her EF was 49%. Her wedge was 13  with pulmonary artery pressure 35/20. 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 Dr. Dorethea Clan reviewed the data and felt that she  needed treatment for a combination of systolic and diastolic dysfunction.  She needs a recheck  of her echocardiogram in 4 to 6 weeks. He also felt that  she needed cardiac risk factor reduction.   Alicia Peters has a history of hypothyroidism and prior to admission had been  on Levothyroxine. She had a TSH checked twice and those were both low at  0.132 and 0.86. However, free T4 was within normal limits. She is to  continue taking her Synthroid and follow-up with her family physician. For  her hypertension she was started on a low dose of a diuretic as well as  Imdur and Norvasc at low doses. Her blood pressure was well controlled.   She was followed closely by Dr. Jayme Cloud during the hospital stay. It was  felt that she responded to abstinence from smoking and therefore, she had a  combination of pulmonary edema as well as smoker's bronchiolitis. The best  treatment is smoking cessation. She needs pulmonary function testing as an  outpatient and follow-up with Dr. Jayme Cloud.   On November 22, 2005 Dr. Dorethea Clan was contacted by [employer]. They contacted her to explain that they would not pay for her stay  on March 1. They felt it was not medically necessary. They contacted Dr.  Dorethea Clan to request discharge and he felt that the patient would medically  benefit from 12 more hours of hospital stay. He explained the situation to  the patient and explained that this was not appropriate medical care. She is  being forced to leave the hospital due to fear of financial repercussions  and not because her medical condition is stable enough for discharge. Dr.  Dorethea Clan felt that this unfortunate situation was thrust upon the patient with  no medical recourse for cardiology. Alicia Peters is, therefore, discharged  on November 22, 2005 with outpatient follow-up arranged.   DISCHARGE INSTRUCTIONS:  1.  Her activity level is to be increased slowly.  2.  She is to stick to a diet that is low in salt and fat.  3.  She is to call our office for any problems with the catheter site.  4.  She is to follow-up with [facility] for Dr. Dorethea Clan on March 15      at 1:30.  5.  She is to follow-up with her family care physician as well.   DISCHARGE MEDICATIONS:  1.  Estradiol 2 mg q day.  2.  Lovastatin 20 mg a day.  3.  Albuterol metered dose inhaler p.r.n.  4.  Advair b.i.d.  5.  Levothyroxine 88 mcg q day.  6.  Imdur 30 mg 1/2 tab q day.  7.  Vasotec 2.5 mg b.i.d.  8.  Pyridium 100 mg t.i.d.  9.  Lisinopril HCT and atenolol are discontinued.      Theodore Demark, P.A. [facility]      Vida Roller, M.D.  Electronically Signed    RB/MEDQ  D:  11/22/2005  T:  11/23/2005  Job:  [id number]   cc:   Jorene Guest, Dr.  Estell Harpin Manvel

## 2011-02-09 NOTE — Procedures (Signed)
NAME:  Alicia Peters, BRAGDON NO.:  [id number]   MEDICAL RECORD NO.:  [id number]          PATIENT TYPE:  EMS   LOCATION:  ED                            FACILITY:  [facility]   PHYSICIAN:  Vida Roller, M.D.   DATE OF BIRTH:  26-Nov-1955   DATE OF PROCEDURE:  11/13/2005  DATE OF DISCHARGE:                                  ECHOCARDIOGRAM   PRIMARY CARE PHYSICIAN:  Dr. Lavera Guise in the hospitalist group at [facility].   HISTORY OF PRESENT ILLNESS:  This is a 55 year old woman, intubated in the  emergency room after an acute exacerbation of asthma who had ST-segment  depression across her anterior precordium. Technical quality of the study is  difficult.   M-MODE TRACINGS:  The left atrium is 25 mm.   The septum is 11 mm.   Posterior wall is 11 mm.   Left ventricular diastolic dimension is 30 mm.   Left ventricular systolic dimension is 19 mm.   TWO-D AND DOPPLER IMAGING:  The left ventricle is normal size. There is  preserved LV systolic function with an estimated ejection fraction of 65-  70%. There are no obvious wall motion abnormalities. The patient is quite  tachycardic, but the parasternal long axis views were actually reasonably  good.   The right ventricle appears to be normal size with normal systolic function.   Both atria were difficult to see but appeared to be normal size.   There was no obvious valvular heart disease seen on either color flow or  pulse wave Doppler.   There is no pericardial effusion.      Vida Roller, M.D.  Electronically Signed     JH/MEDQ  D:  11/13/2005  T:  11/14/2005  Job:  [id number]

## 2011-02-09 NOTE — Consult Note (Signed)
NAMEMarland Kitchen  TREONNA, KLEE NO.:  [id number]   MEDICAL RECORD NO.:  [id number]          PATIENT TYPE:  INP   LOCATION:  2114                         FACILITY:  [facility]   PHYSICIAN:  Coralyn Helling, M.D.      DATE OF BIRTH:  1955-10-02   DATE OF CONSULTATION:  11/13/2005  DATE OF DISCHARGE:                                   CONSULTATION   REFERRING PHYSICIAN:  Costilla Bing, M.D.   REASON FOR CONSULTATION:  Acute hypoxic respiratory failure secondary to  asthma requiring intubation with mechanical ventilation.   HISTORY OF PRESENT ILLNESS:  This is a 55 year old female who presented to  [facility] emergency department complaining of increasing shortness of  breath apparently for the last one day.  She was given prednisone and  nebulizer treatments, however, failed to improve.  It was also noted that  she had sinus tachycardia with ST segment depressions diffusely on her EKG.  She continued to deteriorate with regards to her respiratory status and was  subsequently intubated with a #5 endotracheal tube and placed on mechanical  ventilation.  I am not able to obtain any history from the patient at this  time as she is intubated and sedated and therefore, the entire information  was obtained from review of the chart.  She was initially transferred to the  intensive care unit at [facility], but due to the EKG changes and  concern for underlying cardiac ischemia, the patient was transferred to  [facility] for further cardiac evaluation.  She was also noted to  have a temperature up to 101.9 and chest x-ray at [facility] showed right  upper lobe atelectasis and she was empirically started on antibiotics with  Rocephin and Zithromax.   PAST MEDICAL HISTORY:  1.  Asthma.  2.  Hypertension.  3.  Hypothyroidism.  4.  She has had an appendectomy.  5.  She is status post total abdominal hysterectomy with bilateral salpingo-      oophorectomy.   ALLERGIES:  1.  SULFA.  2.  CIPRO.   MEDICATIONS:  1.  Advair.  2.  Albuterol.  3.  Levoxyl 88 mcg daily.  4.  Estradiol 2 mg daily.  5.  Lipitor 20 mg daily.  6.  Tenormin 25 mg daily.  7.  Lisinopril/hydrochlorothiazide combination 10/12.5 mg daily.   SOCIAL HISTORY:  She continues to smoke approximately a half a pack of  cigarettes per day.   FAMILY HISTORY:  Unobtainable.   REVIEW OF SYSTEMS:  Unobtainable.   PHYSICAL EXAMINATION:  GENERAL:  She is seen in the emergency department at  [facility].  She is arouseable and follow commands but then quickly  falls back to sleep.  She is intubated on mechanical ventilation.  VITAL SIGNS:  Her blood pressure was 119/69, heart rate was 125, temperature  was 101.9, respiratory rate was 26.  Her oxygen saturation presently is  100%.  HEENT:  Pupils are reactive.  There is no sinus tenderness.  NECK:  There is no lymphadenopathy.  HEART:  S1/S2.  No murmurs.  CHEST:  There is a prolonged expiratory phase, decreased breath sounds, but  no wheezing.  ABDOMEN:  Soft.  Mildly distended.  Decreased bowel sounds.  EXTREMITIES:  There was no edema, cyanosis, or clubbing.  NEUROLOGIC:  She was arouseable and able to move all extremities on command.   LABORATORY DATA:  Arterial blood gas:  pH was 7.42, PCO2 was 32.9, PO2 was  63.2.  Troponin is 0.02.  CK is 91, CK-MB is 0.2.  BNP is 70.4.  CBC:  WBC  is 13.7, hemoglobin is 14.5, hematocrit is 42.5, platelets 399.  Sodium is  135, potassium is 3.1, chloride is 102, CO2 is 22.  BUN is 11, creatinine  0.9, calcium is 8.9, glucose is 153.   Chest x-ray showed right upper lobe atelectasis which improved after  intubation, otherwise no infiltrates.   IMPRESSION:  This is a 55 year old female who appears to have acute hypoxic  respiratory failure, secondary to an asthma exacerbation, subsequently  requiring intubation with mechanical ventilation.  At this point I would  continue her on her  mechanical ventilation.  I have made adjustments in her  ventilator to decrease her respiratory rate and tidal volume in an attempt  to decrease her intrinsic PEEP.  I would continue her on intravenous  glucocorticoids as well as nebulizer treatments and Singulair to decrease  airway resistance.  I would empirically continue antibiotic coverage with  Rocephin and Zithromax and follow up with a VAL.  I would also check her  thyroid function tests and urine drug screen and then follow up with a chest  x-ray and arterial blood gas analysis and then further recommendations with  regards to her possible coronary disease and hypertension will be made by  cardiology service.  I would also start her on the ICU hyperglycemia  protocol while she is on systemic steroids as well as Protonix for peptic  ulcer disease prophylaxis and Lovenox for DVT prophylaxis.      Coralyn Helling, M.D.  Electronically Signed     VS/MEDQ  D:  11/13/2005  T:  11/13/2005  Job:  [id number]

## 2011-02-09 NOTE — Discharge Summary (Signed)
   NAME:  Alicia Peters, Alicia Peters                         ACCOUNT NO.:  [id number]   MEDICAL RECORD NO.:  [id number]                   PATIENT TYPE:  INP   LOCATION:  A321                                 FACILITY:  [facility]   PHYSICIAN:  Lazaro Arms, M.D.                DATE OF BIRTH:  [birth year]   DATE OF ADMISSION:  01/05/2003  DATE OF DISCHARGE:  01/07/2003                                 DISCHARGE SUMMARY   DISCHARGE DIAGNOSES:  1. Status post a total abdominal hysterectomy and bilateral salpingo-     oophorectomy.  2. Fibroid uterus.  3. Unremarkable postoperative course.   PROCEDURE:  TAH/BSO.   SURGEON:  Lazaro Arms, M.D.   Please refer to the transcribed History and Physical and the Operative Note  for details of admission to the hospital.   HOSPITAL COURSE:  The patient was admitted postoperatively.  She did have  trouble with nausea and the Dilaudid PCA pump.  As a result I stopped that  and put her on Buprenex.  Otherwise, her postoperative course was entirely  unremarkable.  She tolerated clear liquids and then a regular diet.  She  voided without symptoms and was passing flatus.  She tolerated transition to  oral pain medicine.  Her incision remained clean, dry, and intact.  She  remained afebrile with stable vital signs and she was ambulatory.  Her  hemoglobin and hematocrit on postoperative day #2 was 11.7 and 31.7.   DISPOSITION:  She was discharged to home on the morning of postoperative day  #2 on Toradol, Tylox, and Duricef and to follow up in the office on April 19  as scheduled.  If she has any problems between now and then she will give [place]  a call.                                               Lazaro Arms, M.D.    Loraine Maple  D:  01/07/2003  T:  01/07/2003  Job:  [id number]

## 2011-02-09 NOTE — H&P (Signed)
      January 04, 2003   ATTN:   RE:      ZAKEYA, JUNKER [facility] # [id number]           Date of Admission:           Date of Discharge:    HISTORY OF PRESENT ILLNESS:  The patient is a 55 year old African-American  female, gravida 2, para 2, status post tubal ligation, who is admitted for  abdominal hysterectomy.  She was referred to me from [facility], and I saw her initially on 12/25/02.  She was referred because of  bleeding every two weeks with heavy clotting and prolonged bleeding.  She  has been bleeding heavily, she states, approximately 10 years.  She also has  very bad cramping with her bleeding.  She also complains of discomfort with  intercourse and pain which radiates down her legs and throughout her  stomach.  On examination she was found to have a 12-14 weeks' fibroid uterus  with a large posterior myoma, which would explain her pain radiating down  her legs.  As a result, she is admitted for abdominal hysterectomy.   PAST MEDICAL HISTORY:  Hypertension.  She takes Norvasc 5 mg a day.   PAST SURGICAL HISTORY:  She has had a tubal ligation in 1993, appendectomy,  and she has had three breast cysts removed.   PAST OBSTETRICAL HISTORY:  Two vaginal deliveries.   ALLERGIES:  SULFA drugs.   MEDICATIONS:  1. Norvasc 5 mg.  2. Allegra 180 mg for seasonal allergies.  3. Stool softener.   REVIEW OF SYSTEMS:  Currently otherwise negative.   SOCIAL HISTORY:  She works at [employer] as a Scientist, product/process development.  She smokes  about 10 cigarettes per day.   PHYSICAL EXAMINATION:  VITAL SIGNS:  Blood pressure is 120/80, weight is 154  pounds.  Her height is 5 feet 4 inches.  Her hemoglobin in the office was  13.9  HEENT:  Unremarkable.  NECK:  Thyroid normal.  CHEST:  Lungs clear.  CARDIAC:  Regular rate and rhythm without murmurs, rubs, or gallops.  BREASTS:  Deferred.  ABDOMEN:  Benign.  PELVIC:  Enlarged fibroid uterus, 12-14 weeks' size, with large  posterior  myoma.  EXTREMITIES:  No edema.  NEUROLOGIC:  Grossly intact.   IMPRESSION:  1. Enlarged fibroid uterus.  2. Hypermenorrhea.  3. Dysmenorrhea.  4. Dyspareunia.   PLAN:  The patient is admitted for abdominal hysterectomy.  She understands  the indications, the risks and benefits of the procedure, and will proceed.                                               Lazaro Arms, M.D.    Loraine Maple  D:  01/04/2003  T:  01/04/2003  Job:  [id number]

## 2011-02-09 NOTE — Consult Note (Signed)
NAME:  Alicia Peters, Alicia Peters                         ACCOUNT NO.:  [id number]   MEDICAL RECORD NO.:  [id number]                   PATIENT TYPE:  EMS   LOCATION:  ED                                   FACILITY:  [facility]   PHYSICIAN:  Langley Gauss, M.D.                DATE OF BIRTH:  Jun 01, 1956   DATE OF CONSULTATION:  01/16/2003  DATE OF DISCHARGE:                                   CONSULTATION   REQUESTING PHYSICIAN:  Dr. Hulan Saas.   HISTORY:  This is a 55 year old gravida 2, para 2, with 2 prior vaginal  deliveries who reportedly underwent TAH-BSO by Dr. Duane Lope on  01/05/2003.  Patient states the operative procedure was without  complications.  She was discharged to home on postoperative day number two  and was seen in the office of [facility] on 01/12/2003, at which  time her examination was completely benign.  Patient has continued to use  hormonal replacement therapy.   Patient's history is that she has had some postoperative constipation.  Upon  contacting the office at [facility] on 01/13/2003, complaining of  some spotting, she was advised that this was due to straining while  attempting to have a bowel movement, but she was advised to utilize Dulcolax  suppositories; Dulcolax suppository was utilized with a result of a very  large, painful bowel movement.  Patient, thereafter, on the p.m. of  01/15/2003, stated that she awoke during the night and had bleeding, bright  red in nature, equivalent to her menses.  Throughout the evening, she was  required to change her pad several times with findings of this bright red  vaginal bleeding, about equivalent to a menses.  Patient is absolutely  certain that it was not rectal bleeding or of bladder etiology.  Patient did  not describe any significant cramping or increased pain associated with  these bleeding episodes.  For whatever reason, the patient contacted [facility] and was transported to [facility] at this time for  evaluation.  She does state that since arrival and just prior to performance  of the examination, the bleeding had markedly nearly completely subsided.  The pain was described as a 4/10, for which patient has continued to take  postoperative hydrocodone.   At home, patient denies any difficulty with voiding, she denies any  diarrhea, and, most pertinently, she denies any fevers.  Bowel habits, p.o.  intake have otherwise been normal with no significant nausea, vomiting,  diarrhea.   PAST MEDICAL HISTORY:   ALLERGIES:  She states she is allergic to SULFA.   CURRENT MEDICATIONS:  1. Hydrocodone on a p.r.n. basis.  2. She has previously utilized Dulcolax suppository.  3. She, in addition, is currently using a hormonal replacement patch.   FAMILY HISTORY:  Noncontributory.   PHYSICAL EXAMINATION:  GENERAL:  Patient is in no acute distress.  VITAL  SIGNS:  Temp is 98.4, blood pressure 136/86, pulse of 95, respiratory  rate is 20.  HEENT:  Negative.  No adenopathy.  NECK:  Supple.  Thyroid is nonpalpable.  Neck is supple.  LUNGS:  Clear.  CARDIOVASCULAR EXAM:  Regular rate and rhythm.  ABDOMEN:  Soft and nontender.  Pfannenstiel incision is noted with flaking  surgical glue noted along the entirety of the incision.  No evidence of any  hematoma, seroma within the incision itself.  The abdomen is noted to be  soft and nontender.  No rebound or guarding identified.  PELVIC EXAM:  Reveals no evidence of any external genital bleeding.  Patient  states that the pad removed does not have any vaginal bleeding but rather  somewhat of a dark brown discharge.  Sterile speculum examination reveals  normal anterior and posterior walls of the vagina.  The vaginal cuff is  visualized and noted to be in excellent repair with no evidence of any  dehiscence.  No pooling of fluid.  No abnormal discharge is identified.  Specifically, the suture line is visualized at the apex  of the vaginal cuff,  noted to be no evidence of any active bleeding from this site.  As a  precaution, Silver Nitrate is placed at the visible tissues of the vaginal  cuff followed by gentle manipulation with a large Q-Tip, which does not  result nor does it reveal any evidence of any active bleeding.   LABORATORY DATA:  Hemoglobin 12.3, hematocrit 36.1, white count is 8.1.   ASSESSMENT:  Patient is two weeks status post uncomplicated total abdominal  hysterectomy with bilateral salpingo-oophorectomy and presents only with  duration of vaginal bleeding, bright red in nature, about equivalent to a  menses.  Patient is noted to be in no acute distress, and the fact that by  the time I have evaluated the patient, the bleeding has markedly abated.  Impression is  ________ from granulation tissue or loculation of blood  within the vaginal cuff; however, no abnormalities are identified at this  time to explain the bleeding, which the patient has experienced.  She is  advised, however, that should the bleeding recur, she would be better served  as a patient at an office visit, at which time careful evaluation could be,  again, performed, looking for the source of any bleeding.                                               Langley Gauss, M.D.    DC/MEDQ  D:  01/16/2003  T:  01/16/2003  Job:  [id number]   cc:   [facility]   Janetta Hora. Hulan Saas, M.D.  [address]  [place]  [place] [address]  Fax: [phone]

## 2011-05-20 ENCOUNTER — Emergency Department (HOSPITAL_COMMUNITY): Payer: Self-pay

## 2011-05-20 ENCOUNTER — Emergency Department (HOSPITAL_COMMUNITY)
Admission: EM | Admit: 2011-05-20 | Discharge: 2011-05-20 | Disposition: A | Discharge status: Home / Self Care | Payer: Self-pay | Attending: Emergency Medicine | Admitting: Emergency Medicine

## 2011-05-20 DIAGNOSIS — Z7982 Long term (current) use of aspirin: Secondary | ICD-10-CM | POA: Insufficient documentation

## 2011-05-20 DIAGNOSIS — J4489 Other specified chronic obstructive pulmonary disease: Secondary | ICD-10-CM | POA: Insufficient documentation

## 2011-05-20 DIAGNOSIS — I10 Essential (primary) hypertension: Secondary | ICD-10-CM | POA: Insufficient documentation

## 2011-05-20 DIAGNOSIS — Y92009 Unspecified place in unspecified non-institutional (private) residence as the place of occurrence of the external cause: Secondary | ICD-10-CM | POA: Insufficient documentation

## 2011-05-20 DIAGNOSIS — Z87891 Personal history of nicotine dependence: Secondary | ICD-10-CM | POA: Insufficient documentation

## 2011-05-20 DIAGNOSIS — M5430 Sciatica, unspecified side: Secondary | ICD-10-CM | POA: Insufficient documentation

## 2011-05-20 DIAGNOSIS — S335XXA Sprain of ligaments of lumbar spine, initial encounter: Secondary | ICD-10-CM | POA: Insufficient documentation

## 2011-05-20 DIAGNOSIS — W010XXA Fall on same level from slipping, tripping and stumbling without subsequent striking against object, initial encounter: Secondary | ICD-10-CM | POA: Insufficient documentation

## 2011-05-20 DIAGNOSIS — Z79899 Other long term (current) drug therapy: Secondary | ICD-10-CM | POA: Insufficient documentation

## 2011-05-20 DIAGNOSIS — J449 Chronic obstructive pulmonary disease, unspecified: Secondary | ICD-10-CM | POA: Insufficient documentation

## 2011-05-20 DIAGNOSIS — S39012A Strain of muscle, fascia and tendon of lower back, initial encounter: Secondary | ICD-10-CM

## 2011-05-20 DIAGNOSIS — E079 Disorder of thyroid, unspecified: Secondary | ICD-10-CM | POA: Insufficient documentation

## 2011-05-20 HISTORY — DX: Unspecified osteoarthritis, unspecified site: M19.90

## 2011-05-20 HISTORY — DX: Chronic obstructive pulmonary disease, unspecified: J44.9

## 2011-05-20 MED ORDER — OXYCODONE-ACETAMINOPHEN 5-325 MG PO TABS
1.0000 | ORAL_TABLET | Freq: Once | ORAL | Status: AC
  Administered 2011-05-20: 1 via ORAL
  Filled 2011-05-20: qty 1

## 2011-05-20 MED ORDER — HYDROCODONE-ACETAMINOPHEN 5-325 MG PO TABS: 1.0000 | ORAL_TABLET | Freq: Four times a day (QID) | ORAL | Status: AC | PRN

## 2011-05-20 MED ORDER — METAXALONE 800 MG PO TABS: 800.0000 mg | ORAL_TABLET | Freq: Three times a day (TID) | ORAL | Status: AC

## 2011-05-20 NOTE — ED Provider Notes (Signed)
History     CSN: [id number] Arrival date & time: 05/20/2011  2:43 PM  Chief Complaint  Patient presents with  . Fall   Patient is a 55 y.o. female presenting with fall. The history is provided by the patient.  Fall Incident onset: Today. Incident: Patient was walking and tripped over her dog landing on her back. Distance fallen: Standing position. Point of impact: Lower back. Pain location: Lower back radiating down her right leg. The pain is moderate. She was ambulatory at the scene. There was no entrapment after the fall. There was no drug use involved in the accident. There was no alcohol use involved in the accident. Pertinent negatives include no visual change, no fever, no numbness, no nausea, no vomiting, no headaches, no loss of consciousness and no tingling. The symptoms are aggravated by activity. She has tried NSAIDs for the symptoms.    Past Medical History  Diagnosis Date  . Arthritis   . Hypertension   . COPD (chronic obstructive pulmonary disease)   . Thyroid disease     Past Surgical History  Procedure Date  . Appendectomy   . Breast surgery   . Abdominal hysterectomy   . Carpal tunnel release   . Trigger finger release     No family history on file.  History  Substance Use Topics  . Smoking status: Former Games developer  . Smokeless tobacco: Not on file  . Alcohol Use: No    OB History    Grav Para Term Preterm Abortions TAB SAB Ect Mult Living                  Review of Systems  Constitutional: Negative for fever.  Gastrointestinal: Negative for nausea and vomiting.  Neurological: Negative for tingling, loss of consciousness, numbness and headaches.  All other systems reviewed and are negative.    Physical Exam  BP 136/72  Pulse 72  Temp(Src) 98.2 F (36.8 C) (Oral)  Resp 21  Ht 5\' 4"  (1.626 m)  Wt 192 lb (87.091 kg)  BMI 32.96 kg/m2  SpO2 100%  Physical Exam  Nursing note and vitals reviewed. Constitutional: She appears well-developed and  well-nourished.  HENT:  Head: Normocephalic and atraumatic.  Right Ear: External ear normal.  Left Ear: External ear normal.  Nose: Nose normal.  Eyes: Conjunctivae and EOM are normal.  Neck: Neck supple. No tracheal deviation present.  Pulmonary/Chest: Effort normal. No stridor. No respiratory distress.  Musculoskeletal: She exhibits no edema and no tenderness.       Lumbar back: She exhibits decreased range of motion, tenderness, pain and spasm. She exhibits no swelling and no edema.  Neurological: She is alert. She is not disoriented. No cranial nerve deficit or sensory deficit. She exhibits normal muscle tone. Coordination normal.       5 out of 5 strength plantar flexion dorsiflexion and leg flexion extension bilaterally  Skin: Skin is warm and dry. No rash noted. She is not diaphoretic. No erythema.  Psychiatric: She has a normal mood and affect. Her behavior is normal. Thought content normal.    ED Course  Procedures Dg Lumbar Spine Complete  05/20/2011  *RADIOLOGY REPORT*  Clinical Data: Fall.  Low back pain.  LUMBAR SPINE - COMPLETE 4+ VIEW  Comparison: 03/19/2009  Findings:  Four views study shows no evidence for acute fracture.  There is loss of disc height at L4-5 and L5-S1.  Advanced facet osteoarthritis is seen at L4-5 and L5-L1 bilaterally.  SI joints are normal.  IMPRESSION: Stable exam.  Degenerative changes in the lower lumbar spine.  No acute findings.  Original Report Authenticated By: ERIC A. MANSELL, M.D.    MDM Patient without signs of fracture.  There is no evidence of any acute neurologic deficit. She does have a history of chronic back pain and there may be an exacerbation of her sciatica component.  Clinical impression: Lumbar strain #2 mild sciatica  Discharged home with prescriptions for pain medications and muscle relaxants. Follow up with her primary doctor if no improvement      Celene Kras, MD 05/20/11 1610

## 2011-05-20 NOTE — ED Notes (Signed)
Pt reports falling last night at home and landing on her back.  Pt reports pain to her mid-lower back, and right leg pain.  Pt denies head injury or loc.

## 2011-06-26 LAB — URINALYSIS, ROUTINE W REFLEX MICROSCOPIC
Leukocytes, UA: NEGATIVE
Nitrite: NEGATIVE
Protein, ur: NEGATIVE
Specific Gravity, Urine: >1.03 — ABNORMAL HIGH
Urobilinogen, UA: 0.2

## 2011-06-26 LAB — URINE MICROSCOPIC-ADD ON

## 2011-11-25 ENCOUNTER — Emergency Department (HOSPITAL_COMMUNITY): Payer: Self-pay

## 2011-11-25 ENCOUNTER — Emergency Department (HOSPITAL_COMMUNITY)
Admission: EM | Admit: 2011-11-25 | Discharge: 2011-11-25 | Disposition: A | Discharge status: Home / Self Care | Payer: Self-pay | Attending: Emergency Medicine | Admitting: Emergency Medicine

## 2011-11-25 ENCOUNTER — Encounter (HOSPITAL_COMMUNITY): Payer: Self-pay

## 2011-11-25 ENCOUNTER — Other Ambulatory Visit: Payer: Self-pay

## 2011-11-25 DIAGNOSIS — R059 Cough, unspecified: Secondary | ICD-10-CM | POA: Insufficient documentation

## 2011-11-25 DIAGNOSIS — R110 Nausea: Secondary | ICD-10-CM | POA: Insufficient documentation

## 2011-11-25 DIAGNOSIS — R05 Cough: Secondary | ICD-10-CM | POA: Insufficient documentation

## 2011-11-25 DIAGNOSIS — M79609 Pain in unspecified limb: Secondary | ICD-10-CM | POA: Insufficient documentation

## 2011-11-25 DIAGNOSIS — G589 Mononeuropathy, unspecified: Secondary | ICD-10-CM | POA: Insufficient documentation

## 2011-11-25 DIAGNOSIS — R079 Chest pain, unspecified: Secondary | ICD-10-CM | POA: Insufficient documentation

## 2011-11-25 DIAGNOSIS — R1013 Epigastric pain: Secondary | ICD-10-CM | POA: Insufficient documentation

## 2011-11-25 DIAGNOSIS — J4489 Other specified chronic obstructive pulmonary disease: Secondary | ICD-10-CM | POA: Insufficient documentation

## 2011-11-25 DIAGNOSIS — I10 Essential (primary) hypertension: Secondary | ICD-10-CM | POA: Insufficient documentation

## 2011-11-25 DIAGNOSIS — Z79899 Other long term (current) drug therapy: Secondary | ICD-10-CM | POA: Insufficient documentation

## 2011-11-25 DIAGNOSIS — G629 Polyneuropathy, unspecified: Secondary | ICD-10-CM

## 2011-11-25 DIAGNOSIS — J449 Chronic obstructive pulmonary disease, unspecified: Secondary | ICD-10-CM | POA: Insufficient documentation

## 2011-11-25 DIAGNOSIS — Z7982 Long term (current) use of aspirin: Secondary | ICD-10-CM | POA: Insufficient documentation

## 2011-11-25 DIAGNOSIS — M129 Arthropathy, unspecified: Secondary | ICD-10-CM | POA: Insufficient documentation

## 2011-11-25 LAB — CBC
MCH: 33.3 pg (ref 26.0–34.0)
MCHC: 34.2 g/dL (ref 30.0–36.0)
MCV: 97.6 fL (ref 78.0–100.0)
Platelets: 294 10*3/uL (ref 150–400)
RBC: 3.75 MIL/uL — ABNORMAL LOW (ref 3.87–5.11)

## 2011-11-25 LAB — BASIC METABOLIC PANEL
CO2: 25 mEq/L (ref 19–32)
Calcium: 8.8 mg/dL (ref 8.4–10.5)
Creatinine, Ser: 0.67 mg/dL (ref 0.50–1.10)
GFR calc non Af Amer: >90 mL/min (ref >90)
Sodium: 141 mEq/L (ref 135–145)

## 2011-11-25 LAB — POCT I-STAT TROPONIN I: Troponin i, poc: 0 ng/mL (ref 0.00–0.08)

## 2011-11-25 MED ORDER — PANTOPRAZOLE SODIUM 20 MG PO TBEC: 20.0000 mg | DELAYED_RELEASE_TABLET | Freq: Every day | ORAL | Status: DC

## 2011-11-25 MED ORDER — GABAPENTIN 300 MG PO CAPS: ORAL_CAPSULE | ORAL | Status: DC

## 2011-11-25 MED ORDER — AMITRIPTYLINE HCL 10 MG PO TABS: 10.0000 mg | ORAL_TABLET | Freq: Every day | ORAL | Status: DC

## 2011-11-25 NOTE — ED Notes (Signed)
CBG checked per EDP's orders, CBG=100

## 2011-11-25 NOTE — ED Notes (Signed)
Pt presents with intermittent sub sternal non radiating chest pain with cough x 1 month and bilateral foot pain.

## 2011-11-25 NOTE — ED Notes (Signed)
Intermittent CP for a month, "grabbing" type pain per pt with SOB

## 2011-11-25 NOTE — ED Provider Notes (Signed)
History   This chart was scribed for [employer]. Colon Branch, MD by Clarita Crane. The patient was seen in room [facility]12[facility]. Patient's care was started at 1144.    CSN: [id number]  Arrival date & time 11/25/11  1144   First MD Initiated Contact with Patient 11/25/11 1241      Chief Complaint  Patient presents with  . Chest Pain  . Cough  . Foot Pain    (Consider location/radiation/quality/duration/timing/severity/associated sxs/prior treatment) HPI Alicia Peters is a 56 y.o. female who presents to the Emergency Department complaining of intermittent moderate chest pain described as grabbing onset 1 month ago and persistent since with associated mild nausea. Notes chest pain is not aggravated by anything. Notes episodes last approximately 1-2 minutes at a time at the most. Reports last episode occurred this morning and resolved on its own several minutes after onset. Patient also c/o constant moderate pain to bilateral feet described as burning and aching onset several months ago and persistent since. Patient with h/o arthritis, HTN, COPD and thyroid disease. Denies h/o diabetes.   PCP- Jorene Guest  Past Medical History  Diagnosis Date  . Arthritis   . Hypertension   . COPD (chronic obstructive pulmonary disease)   . Thyroid disease     Past Surgical History  Procedure Date  . Appendectomy   . Breast surgery   . Abdominal hysterectomy   . Carpal tunnel release   . Trigger finger release     No family history on file.  History  Substance Use Topics  . Smoking status: Former Games developer  . Smokeless tobacco: Not on file  . Alcohol Use: No    OB History    Grav Para Term Preterm Abortions TAB SAB Ect Mult Living                  Review of Systems 10 Systems reviewed and are negative for acute change except as noted in the HPI.  Allergies  Levofloxacin; Sulfonamide derivatives; and Sulfonylureas  Home Medications   Current Outpatient Rx  Name Route Sig Dispense Refill  .  ALBUTEROL SULFATE HFA 108 (90 BASE) MCG/ACT IN AERS Inhalation Inhale 2 puffs into the lungs every 6 (six) hours as needed. Shortness of breath     . ASPIRIN EC 81 MG PO TBEC Oral Take 81 mg by mouth daily.      Marland Kitchen CARVEDILOL 12.5 MG PO TABS Oral Take 12.5 mg by mouth 2 (two) times daily.     . CYCLOBENZAPRINE HCL 10 MG PO TABS Oral Take 10 mg by mouth 3 (three) times daily as needed.     Marland Kitchen ESTRADIOL 2 MG PO TABS Oral Take 2 mg by mouth daily.      Marland Kitchen FLUTICASONE-SALMETEROL 250-50 MCG/DOSE IN AEPB Inhalation Inhale 1 puff into the lungs every 12 (twelve) hours.      Marland Kitchen FOLIC ACID 1 MG PO TABS Oral Take 1 mg by mouth daily.      Marland Kitchen HYDROXYCHLOROQUINE SULFATE 200 MG PO TABS Oral Take 200 mg by mouth 2 (two) times daily.     . ISOSORBIDE DINITRATE 30 MG PO TABS Oral Take 30 mg by mouth daily.      Marland Kitchen LEVOTHYROXINE SODIUM 75 MCG PO TABS Oral Take 75 mcg by mouth daily.      Marland Kitchen LOVASTATIN 40 MG PO TABS Oral Take 40 mg by mouth at bedtime.      . METHOTREXATE 2.5 MG PO TABS Oral Take 15 mg by mouth  once a week. Caution:Chemotherapy. Protect from light....  Patient takes once a week on Sunday...      BP 134/73  Pulse 74  Temp(Src) 98 F (36.7 C) (Oral)  Resp 20  Ht 5\' 4"  (1.626 m)  Wt 192 lb (87.091 kg)  BMI 32.96 kg/m2  SpO2 97%  Physical Exam  Nursing note and vitals reviewed. Constitutional: She is oriented to person, place, and time. She appears well-developed and well-nourished. No distress.  HENT:  Head: Normocephalic and atraumatic.  Eyes: EOM are normal. Pupils are equal, round, and reactive to light.  Neck: Neck supple. No tracheal deviation present.  Cardiovascular: Normal rate, regular rhythm and intact distal pulses.  Exam reveals no gallop and no friction rub.   No murmur heard. Pulmonary/Chest: Effort normal. No respiratory distress. She has no wheezes. She has no rales.  Abdominal: Soft. Bowel sounds are normal. She exhibits no distension. There is no tenderness.    Musculoskeletal: Normal range of motion. She exhibits no edema.       DP pulses intact bilaterally.   Neurological: She is alert and oriented to person, place, and time.       Decreased sensation to bilateral lower legs/feet.   Skin: Skin is warm and dry.  Psychiatric: She has a normal mood and affect. Her behavior is normal.    ED Course  Procedures (including critical care time)  DIAGNOSTIC STUDIES: Oxygen Saturation is 97% on room air, normal by my interpretation.    COORDINATION OF CARE: 12:52PM- Patient informed of current plan for treatment and evaluation and agrees with plan at this time.     Results for orders placed during the hospital encounter of 11/25/11  CBC      Component Value Range   WBC 6.5  4.0 - 10.5 (K/uL)   RBC 3.75 (*) 3.87 - 5.11 (MIL/uL)   Hemoglobin 12.5  12.0 - 15.0 (g/dL)   HCT 16.1  09.6 - 04.5 (%)   MCV 97.6  78.0 - 100.0 (fL)   MCH 33.3  26.0 - 34.0 (pg)   MCHC 34.2  30.0 - 36.0 (g/dL)   RDW 40.9  81.1 - 91.4 (%)   Platelets 294  150 - 400 (K/uL)  BASIC METABOLIC PANEL      Component Value Range   Sodium 141  135 - 145 (mEq/L)   Potassium 3.5  3.5 - 5.1 (mEq/L)   Chloride 108  96 - 112 (mEq/L)   CO2 25  19 - 32 (mEq/L)   Glucose, Bld 119 (*) 70 - 99 (mg/dL)   BUN 10  6 - 23 (mg/dL)   Creatinine, Ser 7.82  0.50 - 1.10 (mg/dL)   Calcium 8.8  8.4 - 95.6 (mg/dL)   GFR calc non Af Amer >90  >90 (mL/min)   GFR calc Af Amer >90  >90 (mL/min)  POCT I-STAT TROPONIN I      Component Value Range   Troponin i, poc 0.00  0.00 - 0.08 (ng/mL)   Comment 3           GLUCOSE, CAPILLARY      Component Value Range   Glucose-Capillary 100 (*) 70 - 99 (mg/dL)    Dg Chest Portable 1 View  11/25/2011  *RADIOLOGY REPORT*  Clinical Data: Cough  PORTABLE CHEST - 1 VIEW  Comparison: Plain film 09/24/2010  Findings: Normal mediastinum and cardiac silhouette.  Normal pulmonary  vasculature.  No evidence of effusion, infiltrate, or pneumothorax.  No acute bony  abnormality.  IMPRESSION: No acute cardiopulmonary process.  Original Report Authenticated By: Genevive Bi, M.D.       MDM  Patient with occasional mid chest pressure and fullness that she can sometimes relieve with antacids. Also with peripheral neuropathy that is currently untreated. Initiated PPI therapy and will give Rx for same. Rx for amitriptyline and neurontin for  Neuropathy. Follow up with PCP.Marland KitchenPt stable in ED with no significant deterioration in condition.The patient appears reasonably screened and/or stabilized for discharge and I doubt any other medical condition or other [facility] requiring further screening, evaluation, or treatment in the ED at this time prior to discharge.  I personally performed the services described in this documentation, which was scribed in my presence. The recorded information has been reviewed and considered.  MDM Reviewed: nursing note and vitals Interpretation: labs and ECG          Nicoletta Dress. Colon Branch, MD 11/25/11 1423

## 2012-04-08 ENCOUNTER — Telehealth: Payer: Self-pay

## 2012-04-08 NOTE — Telephone Encounter (Signed)
Received a referral from Dr. Jorene Guest to schedule pt for a colonoscopy. Her last one was with Dr. Jena Gauss on 07/07/2007 and the next was recommended in 10 years. She said she is not having no rectal bleeding. She does have some constipation but it is relieved by drinking prune juice. I offered OV appt and she declined at this time due to no insurance. She said she is waiting on disability. I also gave her Kathie Rhodes Ratliff's phone number if she needs anything else. She declined to schedule OV, even though, per Ave Filter, office manager, I told her we could bill her and if she could just bring $5.00-10.00 if possible. She said that she would just wait, since the prune juice was helping but she will call if needed.

## 2012-04-09 NOTE — Telephone Encounter (Signed)
Let's just make sure referring provider aware that patient declined OV.

## 2012-04-10 NOTE — Telephone Encounter (Signed)
Letter faxed to PCP.  

## 2013-02-15 ENCOUNTER — Emergency Department (HOSPITAL_COMMUNITY): Payer: Medicare Other

## 2013-02-15 ENCOUNTER — Emergency Department (HOSPITAL_COMMUNITY)
Admission: EM | Admit: 2013-02-15 | Discharge: 2013-02-15 | Disposition: A | Discharge status: Home / Self Care | Payer: Medicare Other | Attending: Emergency Medicine | Admitting: Emergency Medicine

## 2013-02-15 ENCOUNTER — Encounter (HOSPITAL_COMMUNITY): Payer: Self-pay | Admitting: Emergency Medicine

## 2013-02-15 DIAGNOSIS — R21 Rash and other nonspecific skin eruption: Secondary | ICD-10-CM | POA: Insufficient documentation

## 2013-02-15 DIAGNOSIS — Z7982 Long term (current) use of aspirin: Secondary | ICD-10-CM | POA: Insufficient documentation

## 2013-02-15 DIAGNOSIS — R0789 Other chest pain: Secondary | ICD-10-CM | POA: Insufficient documentation

## 2013-02-15 DIAGNOSIS — M129 Arthropathy, unspecified: Secondary | ICD-10-CM | POA: Insufficient documentation

## 2013-02-15 DIAGNOSIS — L299 Pruritus, unspecified: Secondary | ICD-10-CM | POA: Insufficient documentation

## 2013-02-15 DIAGNOSIS — Z87891 Personal history of nicotine dependence: Secondary | ICD-10-CM | POA: Insufficient documentation

## 2013-02-15 DIAGNOSIS — J441 Chronic obstructive pulmonary disease with (acute) exacerbation: Secondary | ICD-10-CM | POA: Insufficient documentation

## 2013-02-15 DIAGNOSIS — IMO0002 Reserved for concepts with insufficient information to code with codable children: Secondary | ICD-10-CM | POA: Insufficient documentation

## 2013-02-15 DIAGNOSIS — Z9889 Other specified postprocedural states: Secondary | ICD-10-CM | POA: Insufficient documentation

## 2013-02-15 DIAGNOSIS — Z79899 Other long term (current) drug therapy: Secondary | ICD-10-CM | POA: Insufficient documentation

## 2013-02-15 DIAGNOSIS — R42 Dizziness and giddiness: Secondary | ICD-10-CM

## 2013-02-15 DIAGNOSIS — E079 Disorder of thyroid, unspecified: Secondary | ICD-10-CM | POA: Insufficient documentation

## 2013-02-15 DIAGNOSIS — M329 Systemic lupus erythematosus, unspecified: Secondary | ICD-10-CM | POA: Insufficient documentation

## 2013-02-15 DIAGNOSIS — I10 Essential (primary) hypertension: Secondary | ICD-10-CM | POA: Insufficient documentation

## 2013-02-15 LAB — CBC WITH DIFFERENTIAL/PLATELET
Basophils Absolute: 0 10*3/uL (ref 0.0–0.1)
Basophils Relative: 1 % (ref 0–1)
Hemoglobin: 12.9 g/dL (ref 12.0–15.0)
MCHC: 34.1 g/dL (ref 30.0–36.0)
Neutro Abs: 3.4 10*3/uL (ref 1.7–7.7)
Neutrophils Relative %: 57 % (ref 43–77)
RDW: 12.5 % (ref 11.5–15.5)
WBC: 6 10*3/uL (ref 4.0–10.5)

## 2013-02-15 LAB — BASIC METABOLIC PANEL
Chloride: 103 mEq/L (ref 96–112)
GFR calc Af Amer: >90 mL/min (ref >90)
Potassium: 3.7 mEq/L (ref 3.5–5.1)

## 2013-02-15 LAB — TROPONIN I: Troponin I: <0.3 ng/mL (ref <0.30)

## 2013-02-15 MED ORDER — KETOROLAC TROMETHAMINE 30 MG/ML IJ SOLN
30.0000 mg | Freq: Once | INTRAMUSCULAR | Status: AC
  Administered 2013-02-15: 30 mg via INTRAVENOUS
  Filled 2013-02-15: qty 1

## 2013-02-15 MED ORDER — MECLIZINE HCL 12.5 MG PO TABS
25.0000 mg | ORAL_TABLET | Freq: Once | ORAL | Status: AC
  Administered 2013-02-15: 25 mg via ORAL
  Filled 2013-02-15: qty 2

## 2013-02-15 MED ORDER — PREDNISONE 20 MG PO TABS: 40.0000 mg | ORAL_TABLET | Freq: Every day | ORAL | Status: DC

## 2013-02-15 NOTE — ED Notes (Signed)
C/o chest pressure, dizziness/R arm achy and rash to face and arms x 1 week intermittent. Nad. No resp distress. Small few red bumps noted to arms. Dry cough

## 2013-02-15 NOTE — ED Notes (Signed)
Seen pcp Thursday. pcp stated sx's could be affect of new med methotrexate, was taking oral but now IM. Nondiaphoretic.

## 2013-02-15 NOTE — ED Provider Notes (Signed)
History    This chart was scribed for Dione Booze, MD by Leone Payor, ED Scribe. This patient was seen in room APA05/APA05 and the patient's care was started 10:47 AM.   CSN: [id number]  Arrival date & time 02/15/13  1022   First MD Initiated Contact with Patient 02/15/13 1045      Chief Complaint  Patient presents with  . Dizziness  . Chest Pain    The history is provided by the patient. No language interpreter was used.    HPI Comments: Alicia Peters is a 57 y.o. female who presents to the Emergency Department complaining of 1 week of ongoing, constant, gradually worsening rash to the face and bilateral arms starting about 1.5 weeks ago. She has used topical anti-itch cream that provides relief for 5-10 minutes only. Pt saw PCP and was told it may be caused by taking methotrexate (for her lupus). She also complains of constant, unchanged chest pressure with associated SOB and dizziness starting 1.5 weeks ago. States the pressure is not affected by changing positions or movement. She denies any nausea, diaphoresis. States the dizziness feels like the room is spinning. Denies difficulty walking or having trouble with her balance or coordination. She has taken half an anti-dizziness pill with some relief. Pt has h/o HTN, COPD, Lupus. Pt is a former smoker but denies alcohol use.  Past Medical History  Diagnosis Date  . Arthritis   . Hypertension   . COPD (chronic obstructive pulmonary disease)   . Thyroid disease   . Lupus     Past Surgical History  Procedure Laterality Date  . Appendectomy    . Breast surgery    . Abdominal hysterectomy    . Carpal tunnel release    . Trigger finger release      History reviewed. No pertinent family history.  History  Substance Use Topics  . Smoking status: Former Games developer  . Smokeless tobacco: Not on file  . Alcohol Use: No    OB History   Grav Para Term Preterm Abortions TAB SAB Ect Mult Living                  Review of  Systems  Constitutional: Negative for diaphoresis.  Respiratory: Positive for chest tightness and shortness of breath.   Gastrointestinal: Negative for nausea and vomiting.  Skin: Positive for rash.  Neurological: Positive for dizziness.  All other systems reviewed and are negative.     Allergies  Levofloxacin; Sulfonamide derivatives; and Sulfonylureas  Home Medications   Current Outpatient Rx  Name  Route  Sig  Dispense  Refill  . albuterol (PROVENTIL HFA;VENTOLIN HFA) 108 (90 BASE) MCG/ACT inhaler   Inhalation   Inhale 2 puffs into the lungs every 6 (six) hours as needed. Shortness of breath          . EXPIRED: amitriptyline (ELAVIL) 10 MG tablet   Oral   Take 1 tablet (10 mg total) by mouth at bedtime.   30 tablet   0   . aspirin EC 81 MG tablet   Oral   Take 81 mg by mouth daily.           . carvedilol (COREG) 12.5 MG tablet   Oral   Take 12.5 mg by mouth 2 (two) times daily.          . cyclobenzaprine (FLEXERIL) 10 MG tablet   Oral   Take 10 mg by mouth 3 (three) times daily as needed.          Marland Kitchen  estradiol (ESTRACE) 2 MG tablet   Oral   Take 2 mg by mouth daily.           . Fluticasone-Salmeterol (ADVAIR) 250-50 MCG/DOSE AEPB   Inhalation   Inhale 1 puff into the lungs every 12 (twelve) hours.           . folic acid (FOLVITE) 1 MG tablet   Oral   Take 1 mg by mouth daily.           Marland Kitchen gabapentin (NEURONTIN) 300 MG capsule      Take one one day 1, one twice a day on day 2, then begin one three times a day   90 capsule   0   . hydroxychloroquine (PLAQUENIL) 200 MG tablet   Oral   Take 200 mg by mouth 2 (two) times daily.          . isosorbide dinitrate (ISORDIL) 30 MG tablet   Oral   Take 30 mg by mouth daily.           Marland Kitchen levothyroxine (SYNTHROID, LEVOTHROID) 75 MCG tablet   Oral   Take 75 mcg by mouth daily.           Marland Kitchen lovastatin (MEVACOR) 40 MG tablet   Oral   Take 40 mg by mouth at bedtime.           . methotrexate  (RHEUMATREX) 2.5 MG tablet   Oral   Take 15 mg by mouth once a week. Caution:Chemotherapy. Protect from light....  Patient takes once a week on Sunday...         . EXPIRED: pantoprazole (PROTONIX) 20 MG tablet   Oral   Take 1 tablet (20 mg total) by mouth daily.   30 tablet   0     BP 137/75  Pulse 66  Temp(Src) 98.3 F (36.8 C) (Oral)  Resp 22  SpO2 97%  Physical Exam  Nursing note and vitals reviewed. Constitutional: She is oriented to person, place, and time. She appears well-developed and well-nourished. No distress.  HENT:  Head: Normocephalic and atraumatic.  Right Ear: Tympanic membrane normal.  Left Ear: Tympanic membrane normal.  Eyes: EOM are normal.  Neck: Neck supple. No tracheal deviation present.  Cardiovascular: Normal rate, regular rhythm and normal heart sounds.   Pulmonary/Chest: Effort normal and breath sounds normal. No respiratory distress. She has no wheezes. She has no rales. She exhibits no tenderness.  Musculoskeletal: Normal range of motion.  Neurological: She is alert and oriented to person, place, and time.  Dizziness is reproduced by head movement.  Skin: Skin is warm and dry. Rash noted.  Papular rash present on arms and face. Non-specific in appearance.  Psychiatric: She has a normal mood and affect. Her behavior is normal.    ED Course  Procedures (including critical care time)  DIAGNOSTIC STUDIES: Oxygen Saturation is 97% on room air, adequate by my interpretation.    COORDINATION OF CARE: 10:58 AM Discussed treatment plan with pt at bedside and pt agreed to plan.   Results for orders placed during the hospital encounter of 02/15/13  CBC WITH DIFFERENTIAL      Result Value Range   WBC 6.0  4.0 - 10.5 K/uL   RBC 3.78 (*) 3.87 - 5.11 MIL/uL   Hemoglobin 12.9  12.0 - 15.0 g/dL   HCT 16.1  09.6 - 04.5 %   MCV 100.0  78.0 - 100.0 fL   MCH 34.1 (*) 26.0 - 34.0 pg  MCHC 34.1  30.0 - 36.0 g/dL   RDW 40.9  81.1 - 91.4 %   Platelets  326  150 - 400 K/uL   Neutrophils Relative % 57  43 - 77 %   Neutro Abs 3.4  1.7 - 7.7 K/uL   Lymphocytes Relative 32  12 - 46 %   Lymphs Abs 1.9  0.7 - 4.0 K/uL   Monocytes Relative 6  3 - 12 %   Monocytes Absolute 0.4  0.1 - 1.0 K/uL   Eosinophils Relative 5  0 - 5 %   Eosinophils Absolute 0.3  0.0 - 0.7 K/uL   Basophils Relative 1  0 - 1 %   Basophils Absolute 0.0  0.0 - 0.1 K/uL  BASIC METABOLIC PANEL      Result Value Range   Sodium 137  135 - 145 mEq/L   Potassium 3.7  3.5 - 5.1 mEq/L   Chloride 103  96 - 112 mEq/L   CO2 24  19 - 32 mEq/L   Glucose, Bld 95  70 - 99 mg/dL   BUN 10  6 - 23 mg/dL   Creatinine, Ser 7.82  0.50 - 1.10 mg/dL   Calcium 8.8  8.4 - 95.6 mg/dL   GFR calc non Af Amer >90  >90 mL/min   GFR calc Af Amer >90  >90 mL/min  TROPONIN I      Result Value Range   Troponin I <0.30  <0.30 ng/mL   Dg Chest 2 View  02/15/2013   *RADIOLOGY REPORT*  Clinical Data: Chest pain, cough, dizziness  CHEST - 2 VIEW  Comparison: 11/25/2011  Findings: Lungs are essentially clear.  No focal consolidation.  No pleural effusion or pneumothorax.  The heart is top normal in size.  Visualized osseous structures are within normal limits.  IMPRESSION: No evidence of acute cardiopulmonary disease.   Original Report Authenticated By: Charline Bills, M.D.    Date: 02/15/2013  Rate: 66  Rhythm: normal sinus rhythm  QRS Axis: normal  Intervals: normal  ST/T Wave abnormalities: nonspecific ST changes  Conduction Disutrbances:none  Narrative Interpretation: Curved ST depression in the inferior and anterolateral leads. When compared with ECG of 11/25/2011, no significant changes are seen.  Old EKG Reviewed: unchanged     1. Rash   2. Vertigo   3. Atypical chest pain       MDM  Rash which seems like it probably is a contact dermatitis based on the way it has spread. Although the appearance is not typical, the pattern is consistent with poison ivy. Chest pain does not seem  typical for cardiac disease being that it is constant and there have been no ECG changes. Old records are reviewed and she did have a heart catheterization 7 years ago which showed no significant coronary artery disease. She'll be given dose of meclizine in the ED and workup is initiated.  Workup is unremarkable. She got good relief with meclizine. She now tells me that the rash that she started on May 2 and she went to an urgent care Center where she was given a tapering dose of prednisone over 6 days. The rash went away but recurred. This does seem to be consistent with a contact dermatitis to something like poison ivy which is not out of their system and 6 days. She will be placed back on prednisone 40 mg a day for 10 days. She states that she has meclizine at home and does not wish a prescription for  that.    I personally performed the services described in this documentation, which was scribed in my presence. The recorded information has been reviewed and is accurate.     Dione Booze, MD 02/15/13 1210

## 2013-05-12 ENCOUNTER — Other Ambulatory Visit (HOSPITAL_COMMUNITY): Payer: Self-pay | Admitting: General Practice

## 2013-05-12 DIAGNOSIS — Z09 Encounter for follow-up examination after completed treatment for conditions other than malignant neoplasm: Secondary | ICD-10-CM

## 2013-05-18 ENCOUNTER — Other Ambulatory Visit (HOSPITAL_COMMUNITY): Payer: Self-pay | Admitting: General Practice

## 2013-05-18 DIAGNOSIS — Z139 Encounter for screening, unspecified: Secondary | ICD-10-CM

## 2013-05-26 ENCOUNTER — Ambulatory Visit (HOSPITAL_COMMUNITY)
Admission: RE | Admit: 2013-05-26 | Discharge: 2013-05-26 | Disposition: A | Discharge status: Home / Self Care | Payer: Medicare Other | Source: Ambulatory Visit | Attending: General Practice | Admitting: General Practice

## 2013-05-26 DIAGNOSIS — Z139 Encounter for screening, unspecified: Secondary | ICD-10-CM

## 2013-05-26 DIAGNOSIS — Z1231 Encounter for screening mammogram for malignant neoplasm of breast: Secondary | ICD-10-CM | POA: Insufficient documentation

## 2013-11-29 ENCOUNTER — Emergency Department (HOSPITAL_COMMUNITY)
Admission: EM | Admit: 2013-11-29 | Discharge: 2013-11-30 | Disposition: A | Discharge status: Home / Self Care | Payer: Medicare HMO | Attending: Emergency Medicine | Admitting: Emergency Medicine

## 2013-11-29 ENCOUNTER — Emergency Department (HOSPITAL_COMMUNITY): Payer: Medicare HMO

## 2013-11-29 ENCOUNTER — Encounter (HOSPITAL_COMMUNITY): Payer: Self-pay | Admitting: Emergency Medicine

## 2013-11-29 DIAGNOSIS — M129 Arthropathy, unspecified: Secondary | ICD-10-CM | POA: Insufficient documentation

## 2013-11-29 DIAGNOSIS — E079 Disorder of thyroid, unspecified: Secondary | ICD-10-CM | POA: Insufficient documentation

## 2013-11-29 DIAGNOSIS — Z791 Long term (current) use of non-steroidal anti-inflammatories (NSAID): Secondary | ICD-10-CM | POA: Insufficient documentation

## 2013-11-29 DIAGNOSIS — J45901 Unspecified asthma with (acute) exacerbation: Principal | ICD-10-CM

## 2013-11-29 DIAGNOSIS — Z7982 Long term (current) use of aspirin: Secondary | ICD-10-CM | POA: Insufficient documentation

## 2013-11-29 DIAGNOSIS — I10 Essential (primary) hypertension: Secondary | ICD-10-CM | POA: Insufficient documentation

## 2013-11-29 DIAGNOSIS — IMO0002 Reserved for concepts with insufficient information to code with codable children: Secondary | ICD-10-CM | POA: Insufficient documentation

## 2013-11-29 DIAGNOSIS — Z8739 Personal history of other diseases of the musculoskeletal system and connective tissue: Secondary | ICD-10-CM | POA: Insufficient documentation

## 2013-11-29 DIAGNOSIS — J441 Chronic obstructive pulmonary disease with (acute) exacerbation: Secondary | ICD-10-CM

## 2013-11-29 DIAGNOSIS — Z87891 Personal history of nicotine dependence: Secondary | ICD-10-CM | POA: Insufficient documentation

## 2013-11-29 DIAGNOSIS — Z79899 Other long term (current) drug therapy: Secondary | ICD-10-CM | POA: Insufficient documentation

## 2013-11-29 LAB — TROPONIN I

## 2013-11-29 LAB — CBC WITH DIFFERENTIAL/PLATELET
BASOS ABS: 0 10*3/uL (ref 0.0–0.1)
Basophils Relative: 0 % (ref 0–1)
EOS ABS: 0.1 10*3/uL (ref 0.0–0.7)
EOS PCT: 1 % (ref 0–5)
HEMATOCRIT: 41.5 % (ref 36.0–46.0)
Hemoglobin: 14.2 g/dL (ref 12.0–15.0)
LYMPHS PCT: 25 % (ref 12–46)
Lymphs Abs: 1.8 10*3/uL (ref 0.7–4.0)
MCH: 32.9 pg (ref 26.0–34.0)
MCHC: 34.2 g/dL (ref 30.0–36.0)
MCV: 96.3 fL (ref 78.0–100.0)
MONO ABS: 0.6 10*3/uL (ref 0.1–1.0)
Monocytes Relative: 9 % (ref 3–12)
Neutro Abs: 4.5 10*3/uL (ref 1.7–7.7)
Neutrophils Relative %: 64 % (ref 43–77)
PLATELETS: 316 10*3/uL (ref 150–400)
RBC: 4.31 MIL/uL (ref 3.87–5.11)
RDW: 12.3 % (ref 11.5–15.5)
WBC: 7.1 10*3/uL (ref 4.0–10.5)

## 2013-11-29 LAB — BASIC METABOLIC PANEL
BUN: 9 mg/dL (ref 6–23)
CALCIUM: 9 mg/dL (ref 8.4–10.5)
CO2: 22 meq/L (ref 19–32)
CREATININE: 0.88 mg/dL (ref 0.50–1.10)
Chloride: 100 mEq/L (ref 96–112)
GFR calc Af Amer: 84 mL/min — ABNORMAL LOW (ref >90)
GFR, EST NON AFRICAN AMERICAN: 72 mL/min — AB (ref >90)
Glucose, Bld: 92 mg/dL (ref 70–99)
Potassium: 4 mEq/L (ref 3.7–5.3)
SODIUM: 135 meq/L — AB (ref 137–147)

## 2013-11-29 MED ORDER — IPRATROPIUM-ALBUTEROL 0.5-2.5 (3) MG/3ML IN SOLN
3.0000 mL | Freq: Once | RESPIRATORY_TRACT | Status: AC
  Administered 2013-11-29: 3 mL via RESPIRATORY_TRACT
  Filled 2013-11-29: qty 3

## 2013-11-29 MED ORDER — IPRATROPIUM BROMIDE 0.02 % IN SOLN
0.5000 mg | Freq: Once | RESPIRATORY_TRACT | Status: AC
  Administered 2013-11-29: 0.5 mg via RESPIRATORY_TRACT
  Filled 2013-11-29: qty 2.5

## 2013-11-29 MED ORDER — METHYLPREDNISOLONE SODIUM SUCC 125 MG IJ SOLR
125.0000 mg | Freq: Once | INTRAMUSCULAR | Status: AC
  Administered 2013-11-29: 125 mg via INTRAVENOUS
  Filled 2013-11-29: qty 2

## 2013-11-29 MED ORDER — ALBUTEROL SULFATE (2.5 MG/3ML) 0.083% IN NEBU
5.0000 mg | INHALATION_SOLUTION | Freq: Once | RESPIRATORY_TRACT | Status: AC
  Administered 2013-11-29: 5 mg via RESPIRATORY_TRACT
  Filled 2013-11-29: qty 6

## 2013-11-29 NOTE — ED Notes (Signed)
Patient complaining of shortness of breath since Thursday, worsening tonight. Reports history of asthma and COPD.

## 2013-11-29 NOTE — ED Provider Notes (Signed)
CSN: [id number]     Arrival date & time 11/29/13  2127 History  This chart was scribed for Hanley Seamen, MD by Dorothey Baseman, ED Scribe. This patient was seen in room APA16A/APA16A and the patient's care was started at 11:02 PM.    Chief Complaint  Patient presents with  . Shortness of Breath   The history is provided by the patient. No language interpreter was used.   HPI Comments: Alicia Peters is a 58 y.o. female with a history of COPD and asthma who presents to the Emergency Department complaining of shortness of breath with associated chest tightness and dry cough onset 3-4 days ago that she states has been progressively worsening today. She states that her symptoms are exacerbated with laying down and exertion. Patient reports using her inhaler and nebulizer at home without significant relief. Patient reports some associated chills and subjective fever (patient is afebrile at 99.8 in the ED) a few days ago. She denies nausea, vomiting, diarrhea. Patient also has a history of HTN, lupus, and thyroid disease.   PCP- [facility].   Past Medical History  Diagnosis Date  . Arthritis   . Hypertension   . COPD (chronic obstructive pulmonary disease)   . Thyroid disease   . Lupus   . Asthma    Past Surgical History  Procedure Laterality Date  . Appendectomy    . Breast surgery    . Abdominal hysterectomy    . Carpal tunnel release    . Trigger finger release     History reviewed. No pertinent family history. History  Substance Use Topics  . Smoking status: Former Games developer  . Smokeless tobacco: Not on file  . Alcohol Use: No   OB History   Grav Para Term Preterm Abortions TAB SAB Ect Mult Living                 Review of Systems  A complete 10 system review of systems was obtained and all systems are negative except as noted in the HPI and PMH.    Allergies  Levofloxacin; Sulfonamide derivatives; Thiazide-type diuretics; Ciprofloxacin; and Sulfonylureas  Home  Medications   Current Outpatient Rx  Name  Route  Sig  Dispense  Refill  . albuterol (PROVENTIL HFA;VENTOLIN HFA) 108 (90 BASE) MCG/ACT inhaler   Inhalation   Inhale 2 puffs into the lungs every 6 (six) hours as needed. Shortness of breath          . aspirin EC 81 MG tablet   Oral   Take 81 mg by mouth daily.           . carvedilol (COREG) 12.5 MG tablet   Oral   Take 12.5 mg by mouth daily.          Marland Kitchen estradiol (ESTRACE) 2 MG tablet   Oral   Take 2 mg by mouth daily.           . Fluticasone-Salmeterol (ADVAIR) 500-50 MCG/DOSE AEPB   Inhalation   Inhale 1 puff into the lungs every 12 (twelve) hours.         . gabapentin (NEURONTIN) 300 MG capsule   Oral   Take 300 mg by mouth 2 (two) times daily.          . hydroxychloroquine (PLAQUENIL) 200 MG tablet   Oral   Take 200 mg by mouth 2 (two) times daily.          . isosorbide mononitrate (IMDUR) 30 MG 24  hr tablet   Oral   Take 30 mg by mouth daily.         Marland Kitchen levothyroxine (SYNTHROID, LEVOTHROID) 75 MCG tablet   Oral   Take 75 mcg by mouth daily.          Marland Kitchen lovastatin (MEVACOR) 40 MG tablet   Oral   Take 40 mg by mouth at bedtime.           . meloxicam (MOBIC) 15 MG tablet   Oral   Take 15 mg by mouth daily.         . ranitidine (ZANTAC) 150 MG tablet   Oral   Take 150 mg by mouth at bedtime.           Triage Vitals: BP 174/93  Pulse 88  Temp(Src) 99.8 F (37.7 C) (Oral)  Resp 32  Ht 5\' 4"  (1.626 m)  Wt 206 lb (93.441 kg)  BMI 35.34 kg/m2  SpO2 97%  Physical Exam  Nursing note and vitals reviewed. Constitutional: She is oriented to person, place, and time. She appears well-developed and well-nourished. No distress.  HENT:  Head: Normocephalic and atraumatic.  Eyes: Conjunctivae and EOM are normal. Pupils are equal, round, and reactive to light.  Neck: Normal range of motion. Neck supple.  Cardiovascular: Normal rate, regular rhythm, normal heart sounds and intact distal  pulses.   Pulses:      Dorsalis pedis pulses are 2+ on the right side, and 2+ on the left side.  Pulmonary/Chest: Effort normal. No respiratory distress. She has wheezes. She exhibits no tenderness.  Very shallow breaths. Tachypneia. Faint expiratory wheezes.   Abdominal: Soft. She exhibits no distension. There is no tenderness.  Musculoskeletal: Normal range of motion.  Neurological: She is alert and oriented to person, place, and time.  Skin: Skin is warm and dry.  Psychiatric: She has a normal mood and affect. Her behavior is normal.    ED Course  Procedures (including critical care time)  DIAGNOSTIC STUDIES: Oxygen Saturation is 97% on room air, normal by my interpretation.    COORDINATION OF CARE: 11:07 PM- Patient received a breathing treatment upon arrival to the ED. Will order an additional breathing treatment. Ordered blood labs (CBC, CMP, troponin) and a chest x-ray. Discussed treatment plan with patient at bedside and patient verbalized agreement.   Results for orders placed during the hospital encounter of 11/29/13  CBC WITH DIFFERENTIAL      Result Value Ref Range   WBC 7.1  4.0 - 10.5 K/uL   RBC 4.31  3.87 - 5.11 MIL/uL   Hemoglobin 14.2  12.0 - 15.0 g/dL   HCT 54.0  08.6 - 76.1 %   MCV 96.3  78.0 - 100.0 fL   MCH 32.9  26.0 - 34.0 pg   MCHC 34.2  30.0 - 36.0 g/dL   RDW 95.0  93.2 - 67.1 %   Platelets 316  150 - 400 K/uL   Neutrophils Relative % 64  43 - 77 %   Neutro Abs 4.5  1.7 - 7.7 K/uL   Lymphocytes Relative 25  12 - 46 %   Lymphs Abs 1.8  0.7 - 4.0 K/uL   Monocytes Relative 9  3 - 12 %   Monocytes Absolute 0.6  0.1 - 1.0 K/uL   Eosinophils Relative 1  0 - 5 %   Eosinophils Absolute 0.1  0.0 - 0.7 K/uL   Basophils Relative 0  0 - 1 %   Basophils Absolute  0.0  0.0 - 0.1 K/uL  BASIC METABOLIC PANEL      Result Value Ref Range   Sodium 135 (*) 137 - 147 mEq/L   Potassium 4.0  3.7 - 5.3 mEq/L   Chloride 100  96 - 112 mEq/L   CO2 22  19 - 32 mEq/L    Glucose, Bld 92  70 - 99 mg/dL   BUN 9  6 - 23 mg/dL   Creatinine, Ser 1.61  0.50 - 1.10 mg/dL   Calcium 9.0  8.4 - 09.6 mg/dL   GFR calc non Af Amer 72 (*) >90 mL/min   GFR calc Af Amer 84 (*) >90 mL/min  TROPONIN I      Result Value Ref Range   Troponin I <0.30  <0.30 ng/mL   Dg Chest 2 View  11/29/2013   CLINICAL DATA:  Shortness of breath, cough, congestion, COPD.  EXAM: CHEST  2 VIEW  COMPARISON:  02/15/2013  FINDINGS: No confluent airspace opacity. Mild interstitial prominence. No pleural effusion or pneumothorax. Cardiomediastinal contours are upper normal to mildly enlarged, similar to prior. No acute osseous finding.  IMPRESSION: Mild interstitial prominence may reflect chronic change and/or atypical pneumonia/viral infection.   Electronically Signed   By: Jearld Lesch M.D.   On: 11/29/2013 23:54    EKG Interpretation   Date/Time:  Sunday November 29 2013 21:55:11 EDT Ventricular Rate:  90 PR Interval:  166 QRS Duration: 88 QT Interval:  378 QTC Calculation: 462 R Axis:   39 Text Interpretation:  Normal sinus rhythm Cannot rule out Anterior infarct  (cited on or before 25-Nov-2011) Marked ST abnormality, possible lateral  subendocardial injury Abnormal ECG When compared with ECG of 15-Feb-2013  10:44, No significant change was found Confirmed by [employer] Drivers  MD, Jonny Ruiz  [phone]) on 11/29/2013 11:01:25 PM      MDM   Nursing notes and vitals signs, including pulse oximetry, reviewed.  Summary of this visit's results, reviewed by myself:  Labs:  Results for orders placed during the hospital encounter of 11/29/13 (from the past 24 hour(s))  CBC WITH DIFFERENTIAL     Status: None   Collection Time    11/29/13  9:45 PM      Result Value Ref Range   WBC 7.1  4.0 - 10.5 K/uL   RBC 4.31  3.87 - 5.11 MIL/uL   Hemoglobin 14.2  12.0 - 15.0 g/dL   HCT 98.1  19.1 - 47.8 %   MCV 96.3  78.0 - 100.0 fL   MCH 32.9  26.0 - 34.0 pg   MCHC 34.2  30.0 - 36.0 g/dL   RDW 29.5  62.1 - 30.8 %    Platelets 316  150 - 400 K/uL   Neutrophils Relative % 64  43 - 77 %   Neutro Abs 4.5  1.7 - 7.7 K/uL   Lymphocytes Relative 25  12 - 46 %   Lymphs Abs 1.8  0.7 - 4.0 K/uL   Monocytes Relative 9  3 - 12 %   Monocytes Absolute 0.6  0.1 - 1.0 K/uL   Eosinophils Relative 1  0 - 5 %   Eosinophils Absolute 0.1  0.0 - 0.7 K/uL   Basophils Relative 0  0 - 1 %   Basophils Absolute 0.0  0.0 - 0.1 K/uL  BASIC METABOLIC PANEL     Status: Abnormal   Collection Time    11/29/13  9:45 PM      Result Value Ref Range  Sodium 135 (*) 137 - 147 mEq/L   Potassium 4.0  3.7 - 5.3 mEq/L   Chloride 100  96 - 112 mEq/L   CO2 22  19 - 32 mEq/L   Glucose, Bld 92  70 - 99 mg/dL   BUN 9  6 - 23 mg/dL   Creatinine, Ser 0.35  0.50 - 1.10 mg/dL   Calcium 9.0  8.4 - 59.7 mg/dL   GFR calc non Af Amer 72 (*) >90 mL/min   GFR calc Af Amer 84 (*) >90 mL/min  TROPONIN I     Status: None   Collection Time    11/29/13  9:45 PM      Result Value Ref Range   Troponin I <0.30  <0.30 ng/mL    Imaging Studies: Dg Chest 2 View  11/29/2013   CLINICAL DATA:  Shortness of breath, cough, congestion, COPD.  EXAM: CHEST  2 VIEW  COMPARISON:  02/15/2013  FINDINGS: No confluent airspace opacity. Mild interstitial prominence. No pleural effusion or pneumothorax. Cardiomediastinal contours are upper normal to mildly enlarged, similar to prior. No acute osseous finding.  IMPRESSION: Mild interstitial prominence may reflect chronic change and/or atypical pneumonia/viral infection.   Electronically Signed   By: Jearld Lesch M.D.   On: 11/29/2013 23:54   12:17 AM Air movement improved after albuterol and Atrovent neb treatment but respirations still shallow with expiratory wheezing. We'll place on one-hour continuous albuterol neb.  2:40 AM Air movement improved significantly after continuous neb treatment. Patient able to ambulate without dyspnea. Will place on steroids and have her followup with her primary care  physician.  I personally performed the services described in this documentation, which was scribed in my presence.  The recorded information has been reviewed and is accurate.    Hanley Seamen, MD 11/30/13 [phone]

## 2013-11-30 MED ORDER — ALBUTEROL (5 MG/ML) CONTINUOUS INHALATION SOLN
10.0000 mg/h | INHALATION_SOLUTION | RESPIRATORY_TRACT | Status: AC
  Administered 2013-11-30: 10 mg/h via RESPIRATORY_TRACT
  Filled 2013-11-30: qty 20

## 2013-11-30 MED ORDER — IBUPROFEN 800 MG PO TABS
800.0000 mg | ORAL_TABLET | Freq: Once | ORAL | Status: AC
  Administered 2013-11-30: 800 mg via ORAL
  Filled 2013-11-30: qty 1

## 2013-11-30 MED ORDER — PREDNISONE 20 MG PO TABS: 60.0000 mg | ORAL_TABLET | Freq: Every day | ORAL | Status: DC

## 2014-01-06 ENCOUNTER — Other Ambulatory Visit (HOSPITAL_COMMUNITY): Payer: Self-pay

## 2014-01-06 DIAGNOSIS — G4730 Sleep apnea, unspecified: Secondary | ICD-10-CM

## 2014-06-21 ENCOUNTER — Other Ambulatory Visit (HOSPITAL_COMMUNITY): Payer: Self-pay | Admitting: Physician Assistant

## 2014-06-21 DIAGNOSIS — Z139 Encounter for screening, unspecified: Secondary | ICD-10-CM

## 2014-06-21 DIAGNOSIS — Z1231 Encounter for screening mammogram for malignant neoplasm of breast: Secondary | ICD-10-CM

## 2014-06-24 ENCOUNTER — Ambulatory Visit (HOSPITAL_COMMUNITY)
Admission: RE | Admit: 2014-06-24 | Discharge: 2014-06-24 | Disposition: A | Discharge status: Home / Self Care | Payer: Medicare HMO | Source: Ambulatory Visit | Attending: Physician Assistant | Admitting: Physician Assistant

## 2014-06-24 DIAGNOSIS — Z1231 Encounter for screening mammogram for malignant neoplasm of breast: Secondary | ICD-10-CM | POA: Insufficient documentation

## 2014-09-01 ENCOUNTER — Other Ambulatory Visit (HOSPITAL_COMMUNITY): Payer: Self-pay | Admitting: Orthopaedic Surgery

## 2014-09-01 DIAGNOSIS — M542 Cervicalgia: Secondary | ICD-10-CM

## 2014-09-07 ENCOUNTER — Encounter (HOSPITAL_COMMUNITY): Payer: Self-pay

## 2014-09-07 ENCOUNTER — Ambulatory Visit (HOSPITAL_COMMUNITY)
Admission: RE | Admit: 2014-09-07 | Discharge: 2014-09-07 | Disposition: A | Discharge status: Home / Self Care | Payer: Medicare HMO | Source: Ambulatory Visit | Attending: Orthopaedic Surgery | Admitting: Orthopaedic Surgery

## 2014-09-07 DIAGNOSIS — M79602 Pain in left arm: Secondary | ICD-10-CM | POA: Diagnosis present

## 2014-09-07 DIAGNOSIS — R200 Anesthesia of skin: Secondary | ICD-10-CM | POA: Diagnosis present

## 2014-09-07 DIAGNOSIS — M4802 Spinal stenosis, cervical region: Secondary | ICD-10-CM | POA: Insufficient documentation

## 2014-09-07 DIAGNOSIS — M5032 Other cervical disc degeneration, mid-cervical region: Secondary | ICD-10-CM | POA: Insufficient documentation

## 2014-09-07 DIAGNOSIS — M542 Cervicalgia: Secondary | ICD-10-CM | POA: Diagnosis present

## 2014-10-27 ENCOUNTER — Encounter: Payer: Self-pay | Admitting: Cardiology

## 2014-10-28 ENCOUNTER — Encounter: Payer: Self-pay | Admitting: *Deleted

## 2014-10-28 ENCOUNTER — Other Ambulatory Visit (INDEPENDENT_AMBULATORY_CARE_PROVIDER_SITE_OTHER): Payer: Medicare HMO

## 2014-10-28 ENCOUNTER — Other Ambulatory Visit: Payer: Self-pay

## 2014-10-28 ENCOUNTER — Encounter: Payer: Self-pay | Admitting: Cardiology

## 2014-10-28 ENCOUNTER — Other Ambulatory Visit: Payer: Self-pay | Admitting: Cardiology

## 2014-10-28 ENCOUNTER — Ambulatory Visit (INDEPENDENT_AMBULATORY_CARE_PROVIDER_SITE_OTHER): Payer: Medicare HMO | Admitting: Cardiology

## 2014-10-28 VITALS — BP 161/93 | HR 67 | Ht 64.0 in | Wt 205.8 lb

## 2014-10-28 DIAGNOSIS — IMO0001 Reserved for inherently not codable concepts without codable children: Secondary | ICD-10-CM

## 2014-10-28 DIAGNOSIS — I2510 Atherosclerotic heart disease of native coronary artery without angina pectoris: Secondary | ICD-10-CM

## 2014-10-28 DIAGNOSIS — I10 Essential (primary) hypertension: Secondary | ICD-10-CM

## 2014-10-28 DIAGNOSIS — R0602 Shortness of breath: Secondary | ICD-10-CM

## 2014-10-28 DIAGNOSIS — J449 Chronic obstructive pulmonary disease, unspecified: Secondary | ICD-10-CM

## 2014-10-28 DIAGNOSIS — E782 Mixed hyperlipidemia: Secondary | ICD-10-CM

## 2014-10-28 MED ORDER — ISOSORBIDE MONONITRATE ER 30 MG PO TB24: 15.0000 mg | ORAL_TABLET | Freq: Every day | ORAL | Status: AC

## 2014-10-28 NOTE — Assessment & Plan Note (Addendum)
Progressive exertional shortness of breath and fatigue over the last few months. 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 I did review her ECG, significant ST-T wave abnormalities are noted and these are in fact old based on serial tracings over the last several years. She is reporting NYHA class 2-3 symptoms. Plan is to follow-up with an echocardiogram to reassess cardiac structure and function including diastolic function and pulmonary pressures if possible, full PFTs to better evaluate status of chronic lung disease, 2 view chest x-ray, and also a Lexiscan Cardiolite to reassess ischemic burden for evaluation of potential progressive CAD. She will otherwise continue her current medications, and I will plan to see her back to review the results.

## 2014-10-28 NOTE — Patient Instructions (Signed)
Your physician recommends that you schedule a follow-up appointment in: 1 mth f/u. Your physician has recommended you make the following change in your medication:  Decrease isosorbide mononitrate to 15 mg daily. Please break your 30 mg tablet in half daily. Continue all other medications the same. Your physician has requested that you have an echocardiogram. Echocardiography is a painless test that uses sound waves to create images of your heart. It provides your doctor with information about the size and shape of your heart and how well your heart's chambers and valves are working. This procedure takes approximately one hour. There are no restrictions for this procedure. Your physician has requested that you have a lexiscan myoview. For further information please visit [email or web address] Please follow instruction sheet, as given. A chest x-ray takes a picture of the organs and structures inside the chest, including the heart, lungs, and blood vessels. This test can show several things, including, whether the heart is enlarges; whether fluid is building up in the lungs; and whether pacemaker / defibrillator leads are still in place. Your physician has recommended that you have a pulmonary function test. Pulmonary Function Tests are a group of tests that measure how well air moves in and out of your lungs.

## 2014-10-28 NOTE — Assessment & Plan Note (Signed)
Currently not on statin therapy. This is been followed by primary care.

## 2014-10-28 NOTE — Assessment & Plan Note (Signed)
Blood pressure is elevated today. She reports compliance with her medications including Coreg, Imdur, and low-dose Lasix. May need to consider additional agent such as Norvasc or ARB. Will await follow-up testing first.

## 2014-10-28 NOTE — Assessment & Plan Note (Signed)
Mild, nonobstructive disease as of 2007 with most significant lesion being a 50-60% small obtuse marginal branch. Follow-up Cardiolite in 2010 was negative for ischemia.

## 2014-10-28 NOTE — Progress Notes (Signed)
Cardiology Office Note  Date: 10/28/2014   ID: Alicia Peters, DOB 06-15-1956, MRN [id number]  PCP: Abran Richard PA-C  Primary Cardiologist: Nona Dell, MD   Chief Complaint  Patient presents with  . Shortness of Breath    History of Present Illness: Alicia Peters is a 59 y.o. female referred to the office for cardiology consultation by Ms. Baucom PA-C regarding shortness of breath. She has a history of nonobstructive CAD by cardiac catheterization in 2007, last seen in our office in 2011. Over the last 3 or 4 months, Alicia Peters states that she has been more short of breath and fatigued with exertion. No definitive chest pain or tightness, occasionally has some wheezing. As outlined below, she has a reported history of COPD, previous ventilatory dependent respiratory failure in 2007 with bronchiolitis obliterans and organizing pneumonia. She has not been following with a pulmonary specialist. She reports compliance with Advair, does not use albuterol regularly.  Last ischemic evaluation in 2010 is outlined below, negative at that time. She has had previous documentation of normal LVEF as well. No recent reassessment however.  She is reporting NYHA class 2-3 dyspnea. She has not had any palpitations or dizziness. No definite orthopnea or PND. Weight has been stable as documented below.   We discussed her medications, she is continued on medical therapy for coronary atherosclerosis including aspirin, Coreg, and low-dose Imdur - she takes 15 mg since higher doses give her headaches.  I also reviewed her recent ECG done at her primary care office. She has a long-standing history of ECG abnormalities including diffuse ST-T wave changes, ST segment depression of 1-2 mm. This is been the case on reviewing ECGs over the last several years, and has not been associated with obstructive CAD so far.   Past Medical History  Diagnosis Date  . Arthritis   . Essential hypertension   .  BOOP (bronchiolitis obliterans with organizing pneumonia)     VDRF 2007  . Hypothyroidism   . COPD (chronic obstructive pulmonary disease)   . CAD (coronary artery disease)     Nonobstructive at cardiac catheterization 2007  . Bilateral breast cysts     Benign  . SLE (systemic lupus erythematosus)     Past Surgical History  Procedure Laterality Date  . Appendectomy    . Breast surgery    . Abdominal hysterectomy    . Carpal tunnel release    . Trigger finger release      Current Outpatient Prescriptions  Medication Sig Dispense Refill  . albuterol (PROVENTIL HFA;VENTOLIN HFA) 108 (90 BASE) MCG/ACT inhaler Inhale 2 puffs into the lungs every 6 (six) hours as needed. Shortness of breath     . aspirin EC 81 MG tablet Take 81 mg by mouth daily.      . carvedilol (COREG) 12.5 MG tablet Take 12.5 mg by mouth 2 (two) times daily.     Marland Kitchen estradiol (ESTRACE) 2 MG tablet Take 2 mg by mouth daily.      . Fluticasone-Salmeterol (ADVAIR) 500-50 MCG/DOSE AEPB Inhale 1 puff into the lungs every 12 (twelve) hours.    . furosemide (LASIX) 20 MG tablet Take 20 mg by mouth as needed.    . hydroxychloroquine (PLAQUENIL) 200 MG tablet Take 200 mg by mouth 2 (two) times daily.     Marland Kitchen levothyroxine (SYNTHROID, LEVOTHROID) 75 MCG tablet Take 75 mcg by mouth daily.     Marland Kitchen lovastatin (MEVACOR) 40 MG tablet Take 40 mg by  mouth at bedtime.      . meloxicam (MOBIC) 15 MG tablet Take 15 mg by mouth daily.    Marland Kitchen omeprazole (PRILOSEC) 40 MG capsule Take 40 mg by mouth daily.    . isosorbide mononitrate (IMDUR) 30 MG 24 hr tablet Take 0.5 tablets (15 mg total) by mouth daily. 15 tablet 3   No current facility-administered medications for this visit.    Allergies:  Levofloxacin; Sulfonamide derivatives; Thiazide-type diuretics; Ciprofloxacin; and Sulfonylureas   Social History: The patient  reports that she quit smoking about 10 years ago. Her smoking use included Cigarettes. She does not have any smokeless  tobacco history on file. She reports that she does not drink alcohol or use illicit drugs.   Family History: The patient's family history includes Heart disease in her mother; Stroke in her father.   ROS:  Please see the history of present illness. Otherwise, complete review of systems is positive for none.  All other systems are reviewed and negative.    PHYSICAL EXAM: VS:  BP 161/93 mmHg  Pulse 67  Ht 5\' 4"  (1.626 m)  Wt 205 lb 12.8 oz (93.35 kg)  BMI 35.31 kg/m2  SpO2 97%, BMI Body mass index is 35.31 kg/(m^2).  Wt Readings from Last 3 Encounters:  10/28/14 205 lb 12.8 oz (93.35 kg)  09/07/14 204 lb (92.534 kg)  11/29/13 206 lb (93.441 kg)     General: Patient appears comfortable at rest. HEENT: Conjunctiva and lids normal, oropharynx clear with moist mucosa. Neck: Supple, no elevated JVP or carotid bruits, no thyromegaly. Lungs: Clear to auscultation, nonlabored breathing at rest. Cardiac: Regular rate and rhythm, no S3 or significant systolic murmur, no pericardial rub. Abdomen: Soft, nontender, no hepatomegaly, bowel sounds present, no guarding or rebound. Extremities: No pitting edema, distal pulses 2+. Skin: Warm and dry. Musculoskeletal: No kyphosis. Neuropsychiatric: Alert and oriented x3, affect grossly appropriate.   ECG: ECG is not ordered today.   Recent Labwork: 11/29/2013: BUN 9; Creatinine 0.88; Hemoglobin 14.2; Platelets 316; Potassium 4.0; Sodium 135*     Component Value Date/Time   CHOL 189 08/04/2009 1926   TRIG 100 08/04/2009 1926   HDL 46 08/04/2009 1926   CHOLHDL 4.1 Ratio 08/04/2009 1926   VLDL 20 08/04/2009 1926   LDLCALC 123* 08/04/2009 1926    Other Studies Reviewed Today:  1. Cardiac catheterization February 2007: HEMODYNAMIC DATA: 1. Central aorta 186/99, mean 135. 2. Left ventricle 178/14. 3. Right atrium 9-10. 4. RV 35/10. 5. Pulmonary artery 35/20. 6. Pulmonary capillary wedge 13. 7. Superior vena cava saturation  73%. 8. Pulmonary artery saturation 74%. 9. Aortic saturation 98% on 2 liters. 10. Fick cardiac output 3.9 liters per minute. 11. Fick cardiac index 2.2 liters per minute per meter squared. 12. Thermodilution cardiac output 4.2 liters per minute. 13. Thermodilution cardiac index 2.4 liters per minute per meter squared.  ANGIOGRAPHIC DATA: 1. Ventriculography was done in the RAO projection. There did not appear to  be significant mitral regurgitation. There was moderate global  hypokinesis. Ejection fraction was calculated at 49%. 2. The left main was a large caliber vessel that was free of critical  disease. 3. The LAD coursed to the apex, provided the apical vessel and also  basically the posterior descending distribution as well. The LAD has a  fair amount of mild luminal irregularity without any evidence of high  grade stenosis. 4. The circumflex is a moderately large vessel. There is a first marginal  branch that has segmental 30%  plaquing proximally. The distal vessel is  without critical narrowing. The AV portion of the circumflex appears to  be diffusely narrow, although focal stenosis is not noted. There is  about 50% to 60% narrowing at the takeoff of a small marginal branch. 5. The right coronary artery is a non-dominant vessel.  2. Eugenie Birks Cardiolite November 2010: IMPRESSION: Overall normal [employer] as outlined. Resting ST-segment abnormalities are significant, and chronically noted. There is no diagnostic accentuation in the ST-segment abnormalities following Lexiscan infusion, and no chest pain was reported. Perfusion data show evidence of breast attenuation but no ischemia. There is a normal left ventricular ejection fraction of 65% without focal wall motion abnomalities.  3. Echocardiogram October 2010:  Mild LVH with LVEF 55-60%, no wall motion abnormalities, upper normal left atrial chamber  size, upper normal RV chamber size, mild tricuspid regurgitation, unable to assess PASP.   ASSESSMENT AND PLAN:  DYSPNEA Progressive exertional shortness of breath and fatigue over the last few months. 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 I did review her ECG, significant ST-T wave abnormalities are noted and these are in fact old based on serial tracings over the last several years. She is reporting NYHA class 2-3 symptoms. Plan is to follow-up with an echocardiogram to reassess cardiac structure and function including diastolic function and pulmonary pressures if possible, full PFTs to better evaluate status of chronic lung disease, 2 view chest x-ray, and also a Lexiscan Cardiolite to reassess ischemic burden for evaluation of potential progressive CAD. She will otherwise continue her current medications, and I will plan to see her back to review the results.   CORONARY ATHEROSCLEROSIS NATIVE CORONARY ARTERY Mild, nonobstructive disease as of 2007 with most significant lesion being a 50-60% small obtuse marginal branch. Follow-up Cardiolite in 2010 was negative for ischemia.   ESSENTIAL HYPERTENSION, BENIGN Blood pressure is elevated today. She reports compliance with her medications including Coreg, Imdur, and low-dose Lasix. May need to consider additional agent such as Norvasc or ARB. Will await follow-up testing first.   COPD bronchitis Details are not clear. Prior history reviewed. As noted above, follow-up full PFTs and two-view chest x-ray being ordered.   Mixed hyperlipidemia Currently not on statin therapy. This is been followed by primary care.     Current medicines are reviewed at length with the patient today.  The patient has concerns regarding medicines. Mentioned headaches on Imdur 30 mg daily. We discussed reducing the dose to 15 mg  daily.   Orders Placed This Encounter  Procedures  . NM Myocar Multi W/Spect W/Wall Motion / EF  . DG Chest 2 View  . Myocardial Perfusion Imaging  . 2D Echocardiogram without contrast  . Pulmonary Function Test    Disposition: FU with me in 1 month.   Signed, Jonelle Sidle, MD, [facility] 10/28/2014 10:27 AM    [facility] [address] [place], [place], [place] [address] Phone: [phone]; Fax: [phone]

## 2014-10-28 NOTE — Assessment & Plan Note (Signed)
Details are not clear. Prior history reviewed. As noted above, follow-up full PFTs and two-view chest x-ray being ordered.

## 2014-11-03 ENCOUNTER — Encounter (HOSPITAL_COMMUNITY)
Admission: RE | Admit: 2014-11-03 | Discharge: 2014-11-03 | Disposition: A | Discharge status: Home / Self Care | Payer: Medicare HMO | Source: Ambulatory Visit | Attending: Cardiology | Admitting: Cardiology

## 2014-11-03 ENCOUNTER — Encounter (HOSPITAL_COMMUNITY): Payer: Self-pay

## 2014-11-03 ENCOUNTER — Ambulatory Visit (HOSPITAL_COMMUNITY)
Admission: RE | Admit: 2014-11-03 | Discharge: 2014-11-03 | Disposition: A | Discharge status: Home / Self Care | Payer: Medicare HMO | Source: Ambulatory Visit | Attending: Cardiology | Admitting: Cardiology

## 2014-11-03 DIAGNOSIS — I2510 Atherosclerotic heart disease of native coronary artery without angina pectoris: Secondary | ICD-10-CM

## 2014-11-03 DIAGNOSIS — R0602 Shortness of breath: Secondary | ICD-10-CM | POA: Diagnosis not present

## 2014-11-03 DIAGNOSIS — R0600 Dyspnea, unspecified: Secondary | ICD-10-CM | POA: Diagnosis not present

## 2014-11-03 LAB — PULMONARY FUNCTION TEST
DL/VA % pred: 83 %
DL/VA: 4.01 ml/min/mmHg/L
DLCO UNC % PRED: 40 %
DLCO UNC: 9.93 ml/min/mmHg
DLCO cor % pred: 40 %
DLCO cor: 9.93 ml/min/mmHg
FEF 25-75 Post: 0.95 L/sec
FEF 25-75 Pre: 1.62 L/sec
FEF2575-%Change-Post: -41 %
FEF2575-%PRED-POST: 43 %
FEF2575-%PRED-PRE: 74 %
FEV1-%Change-Post: -8 %
FEV1-%PRED-POST: 63 %
FEV1-%Pred-Pre: 69 %
FEV1-Post: 1.36 L
FEV1-Pre: 1.5 L
FEV1FVC-%CHANGE-POST: 0 %
FEV1FVC-%Pred-Pre: 102 %
FEV6-%Change-Post: -8 %
FEV6-%PRED-PRE: 69 %
FEV6-%Pred-Post: 63 %
FEV6-PRE: 1.83 L
FEV6-Post: 1.67 L
FEV6FVC-%PRED-POST: 103 %
FEV6FVC-%Pred-Pre: 103 %
FVC-%CHANGE-POST: -8 %
FVC-%PRED-POST: 61 %
FVC-%PRED-PRE: 66 %
FVC-PRE: 1.83 L
FVC-Post: 1.67 L
POST FEV6/FVC RATIO: 100 %
Post FEV1/FVC ratio: 81 %
Pre FEV1/FVC ratio: 82 %
Pre FEV6/FVC Ratio: 100 %
RV % PRED: 90 %
RV: 1.74 L
TLC % pred: 66 %
TLC: 3.36 L

## 2014-11-03 MED ORDER — TECHNETIUM TC 99M SESTAMIBI - CARDIOLITE
30.0000 | Freq: Once | INTRAVENOUS | Status: AC | PRN
  Administered 2014-11-03: 30 via INTRAVENOUS

## 2014-11-03 MED ORDER — ALBUTEROL SULFATE (2.5 MG/3ML) 0.083% IN NEBU
2.5000 mg | INHALATION_SOLUTION | Freq: Once | RESPIRATORY_TRACT | Status: AC
  Administered 2014-11-03: 2.5 mg via RESPIRATORY_TRACT

## 2014-11-03 MED ORDER — REGADENOSON 0.4 MG/5ML IV SOLN
INTRAVENOUS | Status: AC
  Administered 2014-11-03: 0.4 mg via INTRAVENOUS
  Filled 2014-11-03: qty 5

## 2014-11-03 MED ORDER — SODIUM CHLORIDE 0.9 % IJ SOLN
10.0000 mL | INTRAMUSCULAR | Status: DC | PRN
  Administered 2014-11-03: 10 mL via INTRAVENOUS
  Filled 2014-11-03: qty 10

## 2014-11-03 MED ORDER — TECHNETIUM TC 99M SESTAMIBI GENERIC - CARDIOLITE
10.0000 | Freq: Once | INTRAVENOUS | Status: AC | PRN
  Administered 2014-11-03: 10 via INTRAVENOUS

## 2014-11-03 MED ORDER — SODIUM CHLORIDE 0.9 % IJ SOLN
INTRAMUSCULAR | Status: AC
  Administered 2014-11-03: 10 mL via INTRAVENOUS
  Filled 2014-11-03: qty 3

## 2014-11-03 MED ORDER — REGADENOSON 0.4 MG/5ML IV SOLN
0.4000 mg | Freq: Once | INTRAVENOUS | Status: AC | PRN
Start: 2014-11-03 — End: 2014-11-03
  Administered 2014-11-03: 0.4 mg via INTRAVENOUS

## 2014-11-03 NOTE — Progress Notes (Signed)
Stress Lab Nurses Notes - Jeani Hawking  THAILY HACKWORTH 11/03/2014 Reason for doing test: CAD and Dyspnea Type of test: Marlane Hatcher Nurse performing test: Parke Poisson, RN Nuclear Medicine Tech: Lou Cal Echo Tech: Not Applicable MD performing test: S. McDowell/M.Geni Bers PA Family MD: j.[email or web address] PA-C Test explained and consent signed: Yes.   IV started: Saline lock flushed, No redness or edema and Saline lock started in radiology Symptoms: discomfort in throat Treatment/Intervention: None Reason test stopped: protocol completed After recovery IV was: Discontinued via X-ray tech and No redness or edema Patient to return to Nuc. Med at : 11:30 Patient discharged: Home Patient's Condition upon discharge was: stable Comments: During test BP 139/80 & HR 88.  Recovery BP 146/83 &  HR 76.  Symptoms resolved in recovery.  Erskine Speed T

## 2014-11-04 ENCOUNTER — Telehealth: Payer: Self-pay | Admitting: *Deleted

## 2014-11-04 ENCOUNTER — Ambulatory Visit (HOSPITAL_COMMUNITY)
Admission: RE | Admit: 2014-11-04 | Discharge: 2014-11-04 | Disposition: A | Discharge status: Home / Self Care | Payer: Medicare HMO | Source: Ambulatory Visit | Attending: Cardiology | Admitting: Cardiology

## 2014-11-04 DIAGNOSIS — I2510 Atherosclerotic heart disease of native coronary artery without angina pectoris: Secondary | ICD-10-CM | POA: Insufficient documentation

## 2014-11-04 DIAGNOSIS — R0600 Dyspnea, unspecified: Secondary | ICD-10-CM | POA: Insufficient documentation

## 2014-11-04 NOTE — Telephone Encounter (Signed)
Patient informed. 

## 2014-11-04 NOTE — Telephone Encounter (Signed)
-----   Message from Jonelle Sidle, MD sent at 11/04/2014  8:14 AM EST ----- Reviewed report, looks to be preliminary. PFTs suggestive of significant restrictive defect and reduced diffusion capacity. Await chest x-ray results as well. I expect that she will need formal referral for pulmonary consultation.

## 2014-11-04 NOTE — Telephone Encounter (Signed)
-----   Message from Jonelle Sidle, MD sent at 10/28/2014  4:32 PM EST ----- Study reviewed. LVEF remains normal range with increased filling pressures and diastolic dysfunction. We can probably make some medication adjustments aimed at blood pressure and fluid management, although additional studies are still pending to further investigate her symptoms. For now no changes made.

## 2014-11-04 NOTE — Telephone Encounter (Signed)
-----   Message from Jonelle Sidle, MD sent at 11/04/2014  8:08 AM EST ----- Reviewed report. Low risk stress test, this argues against any significant progression in CAD to explain her shortness of breath.

## 2014-11-05 ENCOUNTER — Telehealth: Payer: Self-pay | Admitting: *Deleted

## 2014-11-05 DIAGNOSIS — R0602 Shortness of breath: Secondary | ICD-10-CM

## 2014-11-05 DIAGNOSIS — IMO0001 Reserved for inherently not codable concepts without codable children: Secondary | ICD-10-CM

## 2014-11-05 NOTE — Telephone Encounter (Signed)
-----   Message from Jonelle Sidle, MD sent at 11/04/2014  4:42 PM EST ----- Reviewed. Chronic interstitial markings described as being stable, no pneumonia or edema. These results along with her PFT results warrant pulmonary consultation. Please check with her to see if we can arrange this through the [facility] practice, or whether she would like to try to see someone locally.

## 2014-11-05 NOTE — Telephone Encounter (Signed)
Patient informed and is requesting to see a local pulmonologist.

## 2014-12-13 ENCOUNTER — Other Ambulatory Visit (HOSPITAL_COMMUNITY): Payer: Self-pay | Admitting: Pulmonary Disease

## 2014-12-13 DIAGNOSIS — J8410 Pulmonary fibrosis, unspecified: Secondary | ICD-10-CM

## 2014-12-15 ENCOUNTER — Other Ambulatory Visit (HOSPITAL_COMMUNITY): Payer: Medicare HMO

## 2014-12-16 ENCOUNTER — Ambulatory Visit (HOSPITAL_COMMUNITY)
Admission: RE | Admit: 2014-12-16 | Discharge: 2014-12-16 | Disposition: A | Discharge status: Home / Self Care | Payer: Medicare HMO | Source: Ambulatory Visit | Attending: Pulmonary Disease | Admitting: Pulmonary Disease

## 2014-12-16 DIAGNOSIS — J8410 Pulmonary fibrosis, unspecified: Secondary | ICD-10-CM | POA: Insufficient documentation

## 2015-05-04 ENCOUNTER — Telehealth: Payer: Self-pay

## 2015-05-04 NOTE — Telephone Encounter (Signed)
PATIENT RECEIVED LETTER TO SCHEDULE TCS  [phone]

## 2015-05-09 NOTE — Telephone Encounter (Signed)
Pt's last colonoscopy was 07/07/2007 and she is on recall for 06/2017. She has had some constipation, she has not told her PCP yet. Said she will address with PCP and if any more concerns she will call here for appt. Sending letter to PCP.

## 2015-06-24 ENCOUNTER — Other Ambulatory Visit (HOSPITAL_COMMUNITY): Payer: Self-pay | Admitting: Physician Assistant

## 2015-06-24 DIAGNOSIS — Z1231 Encounter for screening mammogram for malignant neoplasm of breast: Secondary | ICD-10-CM

## 2015-07-06 ENCOUNTER — Ambulatory Visit (HOSPITAL_COMMUNITY)
Admission: RE | Admit: 2015-07-06 | Discharge: 2015-07-06 | Disposition: A | Discharge status: Home / Self Care | Payer: Medicare HMO | Source: Ambulatory Visit | Attending: Physician Assistant | Admitting: Physician Assistant

## 2015-07-06 DIAGNOSIS — Z1231 Encounter for screening mammogram for malignant neoplasm of breast: Secondary | ICD-10-CM

## 2015-10-02 ENCOUNTER — Inpatient Hospital Stay (HOSPITAL_COMMUNITY)
Admission: EM | Admit: 2015-10-02 | Discharge: 2015-10-05 | DRG: 191 | Disposition: A | Discharge status: Home / Self Care | Payer: Medicare HMO | Attending: Internal Medicine | Admitting: Internal Medicine

## 2015-10-02 ENCOUNTER — Emergency Department (HOSPITAL_COMMUNITY): Payer: Medicare HMO

## 2015-10-02 ENCOUNTER — Encounter (HOSPITAL_COMMUNITY): Payer: Self-pay

## 2015-10-02 ENCOUNTER — Emergency Department (HOSPITAL_BASED_OUTPATIENT_CLINIC_OR_DEPARTMENT_OTHER): Payer: Medicare HMO

## 2015-10-02 DIAGNOSIS — R0600 Dyspnea, unspecified: Secondary | ICD-10-CM

## 2015-10-02 DIAGNOSIS — I5032 Chronic diastolic (congestive) heart failure: Secondary | ICD-10-CM | POA: Diagnosis present

## 2015-10-02 DIAGNOSIS — M069 Rheumatoid arthritis, unspecified: Secondary | ICD-10-CM | POA: Diagnosis present

## 2015-10-02 DIAGNOSIS — Z87891 Personal history of nicotine dependence: Secondary | ICD-10-CM | POA: Diagnosis not present

## 2015-10-02 DIAGNOSIS — Z7982 Long term (current) use of aspirin: Secondary | ICD-10-CM

## 2015-10-02 DIAGNOSIS — Z823 Family history of stroke: Secondary | ICD-10-CM

## 2015-10-02 DIAGNOSIS — M1990 Unspecified osteoarthritis, unspecified site: Secondary | ICD-10-CM | POA: Diagnosis present

## 2015-10-02 DIAGNOSIS — E039 Hypothyroidism, unspecified: Secondary | ICD-10-CM | POA: Diagnosis present

## 2015-10-02 DIAGNOSIS — Z8249 Family history of ischemic heart disease and other diseases of the circulatory system: Secondary | ICD-10-CM

## 2015-10-02 DIAGNOSIS — J440 Chronic obstructive pulmonary disease with acute lower respiratory infection: Secondary | ICD-10-CM | POA: Diagnosis present

## 2015-10-02 DIAGNOSIS — M329 Systemic lupus erythematosus, unspecified: Secondary | ICD-10-CM | POA: Diagnosis present

## 2015-10-02 DIAGNOSIS — J209 Acute bronchitis, unspecified: Secondary | ICD-10-CM | POA: Diagnosis present

## 2015-10-02 DIAGNOSIS — I10 Essential (primary) hypertension: Secondary | ICD-10-CM | POA: Diagnosis present

## 2015-10-02 DIAGNOSIS — J441 Chronic obstructive pulmonary disease with (acute) exacerbation: Secondary | ICD-10-CM | POA: Diagnosis present

## 2015-10-02 DIAGNOSIS — K219 Gastro-esophageal reflux disease without esophagitis: Secondary | ICD-10-CM | POA: Diagnosis present

## 2015-10-02 DIAGNOSIS — I110 Hypertensive heart disease with heart failure: Secondary | ICD-10-CM | POA: Diagnosis present

## 2015-10-02 DIAGNOSIS — I429 Cardiomyopathy, unspecified: Secondary | ICD-10-CM | POA: Diagnosis present

## 2015-10-02 DIAGNOSIS — R0602 Shortness of breath: Secondary | ICD-10-CM | POA: Diagnosis present

## 2015-10-02 LAB — CBC WITH DIFFERENTIAL/PLATELET
Basophils Absolute: 0 10*3/uL (ref 0.0–0.1)
Basophils Relative: 0 %
Eosinophils Absolute: 0.2 10*3/uL (ref 0.0–0.7)
Eosinophils Relative: 2 %
HEMATOCRIT: 40.1 % (ref 36.0–46.0)
Hemoglobin: 13.4 g/dL (ref 12.0–15.0)
LYMPHS PCT: 9 %
Lymphs Abs: 0.7 10*3/uL (ref 0.7–4.0)
MCH: 32.2 pg (ref 26.0–34.0)
MCHC: 33.4 g/dL (ref 30.0–36.0)
MCV: 96.4 fL (ref 78.0–100.0)
Monocytes Absolute: 0.5 10*3/uL (ref 0.1–1.0)
Monocytes Relative: 6 %
NEUTROS ABS: 6.3 10*3/uL (ref 1.7–7.7)
NEUTROS PCT: 83 %
Platelets: 315 10*3/uL (ref 150–400)
RBC: 4.16 MIL/uL (ref 3.87–5.11)
RDW: 12.2 % (ref 11.5–15.5)
WBC: 7.6 10*3/uL (ref 4.0–10.5)

## 2015-10-02 LAB — BASIC METABOLIC PANEL
ANION GAP: 9 (ref 5–15)
BUN: 9 mg/dL (ref 6–20)
CO2: 23 mmol/L (ref 22–32)
Calcium: 8.9 mg/dL (ref 8.9–10.3)
Chloride: 105 mmol/L (ref 101–111)
Creatinine, Ser: 0.96 mg/dL (ref 0.44–1.00)
GFR calc Af Amer: >60 mL/min (ref >60)
GFR calc non Af Amer: >60 mL/min (ref >60)
GLUCOSE: 121 mg/dL — AB (ref 65–99)
Potassium: 4.2 mmol/L (ref 3.5–5.1)
Sodium: 137 mmol/L (ref 135–145)

## 2015-10-02 LAB — INFLUENZA PANEL BY PCR (TYPE A & B)
H1N1FLUPCR: NOT DETECTED
INFLBPCR: NEGATIVE
Influenza A By PCR: NEGATIVE

## 2015-10-02 LAB — I-STAT TROPONIN, ED: Troponin i, poc: 0.01 ng/mL (ref 0.00–0.08)

## 2015-10-02 LAB — BRAIN NATRIURETIC PEPTIDE: B Natriuretic Peptide: 333 pg/mL — ABNORMAL HIGH (ref 0.0–100.0)

## 2015-10-02 MED ORDER — CARVEDILOL 12.5 MG PO TABS
12.5000 mg | ORAL_TABLET | Freq: Two times a day (BID) | ORAL | Status: DC
  Administered 2015-10-02 – 2015-10-05 (×6): 12.5 mg via ORAL
  Filled 2015-10-02 (×6): qty 1

## 2015-10-02 MED ORDER — MOMETASONE FURO-FORMOTEROL FUM 100-5 MCG/ACT IN AERO
2.0000 | INHALATION_SPRAY | Freq: Two times a day (BID) | RESPIRATORY_TRACT | Status: DC
  Administered 2015-10-02 – 2015-10-03 (×2): 2 via RESPIRATORY_TRACT
  Filled 2015-10-02: qty 8.8

## 2015-10-02 MED ORDER — IPRATROPIUM BROMIDE 0.02 % IN SOLN: 0.5000 mg | Freq: Once | RESPIRATORY_TRACT | Status: DC

## 2015-10-02 MED ORDER — ENOXAPARIN SODIUM 40 MG/0.4ML ~~LOC~~ SOLN
40.0000 mg | SUBCUTANEOUS | Status: DC
  Administered 2015-10-02 – 2015-10-05 (×4): 40 mg via SUBCUTANEOUS
  Filled 2015-10-02 (×4): qty 0.4

## 2015-10-02 MED ORDER — FUROSEMIDE 10 MG/ML IJ SOLN
40.0000 mg | Freq: Once | INTRAMUSCULAR | Status: AC
  Administered 2015-10-02: 40 mg via INTRAVENOUS
  Filled 2015-10-02: qty 4

## 2015-10-02 MED ORDER — DEXTROSE 5 % IV SOLN
1.0000 g | Freq: Once | INTRAVENOUS | Status: AC
  Administered 2015-10-02: 1 g via INTRAVENOUS
  Filled 2015-10-02: qty 10

## 2015-10-02 MED ORDER — HYDROXYCHLOROQUINE SULFATE 200 MG PO TABS
200.0000 mg | ORAL_TABLET | Freq: Two times a day (BID) | ORAL | Status: DC
  Administered 2015-10-02 – 2015-10-05 (×6): 200 mg via ORAL
  Filled 2015-10-02 (×11): qty 1

## 2015-10-02 MED ORDER — ALBUTEROL SULFATE (2.5 MG/3ML) 0.083% IN NEBU
2.5000 mg | INHALATION_SOLUTION | RESPIRATORY_TRACT | Status: DC
  Administered 2015-10-02 (×2): 2.5 mg via RESPIRATORY_TRACT
  Filled 2015-10-02 (×2): qty 3

## 2015-10-02 MED ORDER — HYDROCOD POLST-CPM POLST ER 10-8 MG/5ML PO SUER
5.0000 mL | Freq: Once | ORAL | Status: AC
  Administered 2015-10-02: 5 mL via ORAL
  Filled 2015-10-02: qty 5

## 2015-10-02 MED ORDER — PREDNISONE 20 MG PO TABS
60.0000 mg | ORAL_TABLET | Freq: Every day | ORAL | Status: DC
  Administered 2015-10-02 – 2015-10-05 (×4): 60 mg via ORAL
  Filled 2015-10-02 (×4): qty 3

## 2015-10-02 MED ORDER — ALBUTEROL SULFATE (2.5 MG/3ML) 0.083% IN NEBU
2.5000 mg | INHALATION_SOLUTION | Freq: Once | RESPIRATORY_TRACT | Status: AC
  Administered 2015-10-02: 2.5 mg via RESPIRATORY_TRACT
  Filled 2015-10-02: qty 3

## 2015-10-02 MED ORDER — ALBUTEROL SULFATE (2.5 MG/3ML) 0.083% IN NEBU
2.5000 mg | INHALATION_SOLUTION | Freq: Four times a day (QID) | RESPIRATORY_TRACT | Status: DC
Start: 2015-10-03 — End: 2015-10-03
  Administered 2015-10-03: 2.5 mg via RESPIRATORY_TRACT
  Filled 2015-10-02: qty 3

## 2015-10-02 MED ORDER — ALBUTEROL SULFATE (2.5 MG/3ML) 0.083% IN NEBU: 5.0000 mg | INHALATION_SOLUTION | Freq: Once | RESPIRATORY_TRACT | Status: DC

## 2015-10-02 MED ORDER — ALBUTEROL SULFATE (2.5 MG/3ML) 0.083% IN NEBU
2.5000 mg | INHALATION_SOLUTION | Freq: Four times a day (QID) | RESPIRATORY_TRACT | Status: DC | PRN
Start: 2015-10-02 — End: 2015-10-02

## 2015-10-02 MED ORDER — METHYLPREDNISOLONE SODIUM SUCC 125 MG IJ SOLR
125.0000 mg | Freq: Once | INTRAMUSCULAR | Status: AC
  Administered 2015-10-02: 125 mg via INTRAVENOUS
  Filled 2015-10-02: qty 2

## 2015-10-02 MED ORDER — ASPIRIN EC 81 MG PO TBEC
81.0000 mg | DELAYED_RELEASE_TABLET | Freq: Every day | ORAL | Status: DC
  Administered 2015-10-03 – 2015-10-05 (×3): 81 mg via ORAL
  Filled 2015-10-02 (×3): qty 1

## 2015-10-02 MED ORDER — ASPIRIN 81 MG PO CHEW
CHEWABLE_TABLET | ORAL | Status: AC
  Filled 2015-10-02: qty 3

## 2015-10-02 MED ORDER — ALBUTEROL (5 MG/ML) CONTINUOUS INHALATION SOLN
10.0000 mg/h | INHALATION_SOLUTION | Freq: Once | RESPIRATORY_TRACT | Status: AC
  Administered 2015-10-02: 10 mg/h via RESPIRATORY_TRACT
  Filled 2015-10-02: qty 20

## 2015-10-02 MED ORDER — OXYCODONE-ACETAMINOPHEN 5-325 MG PO TABS
2.0000 | ORAL_TABLET | Freq: Once | ORAL | Status: AC
  Administered 2015-10-02: 2 via ORAL
  Filled 2015-10-02: qty 2

## 2015-10-02 MED ORDER — ALBUTEROL SULFATE HFA 108 (90 BASE) MCG/ACT IN AERS: 2.0000 | INHALATION_SPRAY | Freq: Four times a day (QID) | RESPIRATORY_TRACT | Status: DC | PRN

## 2015-10-02 MED ORDER — ESTRADIOL 1 MG PO TABS
2.0000 mg | ORAL_TABLET | Freq: Every day | ORAL | Status: DC
  Administered 2015-10-03 – 2015-10-05 (×3): 2 mg via ORAL
  Filled 2015-10-02: qty 2
  Filled 2015-10-02: qty 1
  Filled 2015-10-02 (×3): qty 2

## 2015-10-02 MED ORDER — SODIUM CHLORIDE 0.9 % IJ SOLN
3.0000 mL | Freq: Two times a day (BID) | INTRAMUSCULAR | Status: DC
  Administered 2015-10-02 – 2015-10-05 (×6): 3 mL via INTRAVENOUS

## 2015-10-02 MED ORDER — ALBUTEROL SULFATE (2.5 MG/3ML) 0.083% IN NEBU
2.5000 mg | INHALATION_SOLUTION | RESPIRATORY_TRACT | Status: DC | PRN
  Filled 2015-10-02: qty 3

## 2015-10-02 MED ORDER — PANTOPRAZOLE SODIUM 40 MG PO TBEC
40.0000 mg | DELAYED_RELEASE_TABLET | Freq: Every day | ORAL | Status: DC
  Administered 2015-10-02 – 2015-10-04 (×3): 40 mg via ORAL
  Filled 2015-10-02 (×3): qty 1

## 2015-10-02 MED ORDER — LEVOTHYROXINE SODIUM 75 MCG PO TABS
75.0000 ug | ORAL_TABLET | Freq: Every day | ORAL | Status: DC
  Administered 2015-10-03 – 2015-10-05 (×3): 75 ug via ORAL
  Filled 2015-10-02 (×3): qty 1

## 2015-10-02 MED ORDER — MELOXICAM 7.5 MG PO TABS
15.0000 mg | ORAL_TABLET | Freq: Every day | ORAL | Status: DC
  Administered 2015-10-03 – 2015-10-05 (×3): 15 mg via ORAL
  Filled 2015-10-02 (×2): qty 2
  Filled 2015-10-02: qty 1
  Filled 2015-10-02: qty 2
  Filled 2015-10-02: qty 1
  Filled 2015-10-02 (×2): qty 2

## 2015-10-02 MED ORDER — ASPIRIN 81 MG PO CHEW
243.0000 mg | CHEWABLE_TABLET | Freq: Once | ORAL | Status: AC
  Administered 2015-10-02: 243 mg via ORAL

## 2015-10-02 MED ORDER — PRAVASTATIN SODIUM 10 MG PO TABS
10.0000 mg | ORAL_TABLET | Freq: Every day | ORAL | Status: DC
  Administered 2015-10-02 – 2015-10-04 (×3): 10 mg via ORAL
  Filled 2015-10-02 (×3): qty 1

## 2015-10-02 MED ORDER — ISOSORBIDE MONONITRATE ER 30 MG PO TB24
15.0000 mg | ORAL_TABLET | Freq: Every day | ORAL | Status: DC
  Administered 2015-10-03 – 2015-10-05 (×3): 15 mg via ORAL
  Filled 2015-10-02 (×3): qty 1

## 2015-10-02 MED ORDER — IPRATROPIUM-ALBUTEROL 0.5-2.5 (3) MG/3ML IN SOLN
3.0000 mL | Freq: Once | RESPIRATORY_TRACT | Status: AC
  Administered 2015-10-02: 3 mL via RESPIRATORY_TRACT
  Filled 2015-10-02: qty 3

## 2015-10-02 MED ORDER — DEXTROSE 5 % IV SOLN
500.0000 mg | INTRAVENOUS | Status: DC
  Administered 2015-10-02: 500 mg via INTRAVENOUS
  Filled 2015-10-02: qty 500

## 2015-10-02 NOTE — Progress Notes (Signed)
Pt complaining of pain and cough. K.Schorr, Np ordered 2 percocet now once, and 5 ml of Tussinex once. Will continue to monitor.

## 2015-10-02 NOTE — H&P (Signed)
[facility] History and Physical  Alicia Peters IOE:[id number] DOB: 1956-03-06 DOA: 10/02/2015  60 y/o ? Non osbtructive CM on Cath 2007-Lexiscan 07/2015=no ischemia LVEF 61% VDRF and BOOP  2007 Multifactorial dyspnea NYHA class ii-iii COPD-Quit smoking 1ppd for 30 yr in 2006-Baseline does not use any inhalers TAH-BSO  5 to six-day history cold + cough  exposed to son and daughter who have had similar illness recently No fever no chills but slightly uncomfortable 1-2 episodes of diarrhea as well Nonbloody non-dark No emesis No myalgia No weakness on any one side of body Denies chest pain but states that she has discomfort when she coughs No significant weight gain, no paroxysmal nocturnal dyspnea, no orthopnea  Emergency room workup concerning for EKG changes Echo was done and read cardiology showing no wall motion abnormalities EF 50% BUN/creatinine 9/0.9 ProBNP 333 WBC 7, hemoglobin 13, platelet 315 Influenza PCR negative Two-view chest x-ray showed cardiomegaly with no infiltrate  Used to work in Designer, 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 Arthritis   . Essential hypertension   . BOOP (bronchiolitis obliterans with organizing pneumonia) (HCC)     VDRF 2007  . Hypothyroidism   . COPD (chronic obstructive pulmonary disease) (HCC)   . CAD (coronary artery disease)     Nonobstructive at cardiac catheterization 2007  . Bilateral breast cysts     Benign  . SLE (systemic lupus erythematosus) (HCC)    Past Surgical History  Procedure Laterality Date  . Appendectomy    . Breast surgery    . Abdominal hysterectomy    . Carpal tunnel release    . Trigger finger release     Social History:  Social History   Social History Narrative    Allergies  Allergen Reactions  . Levofloxacin Other (See Comments)    Allergy unknown to patient.   . Sulfonamide  Derivatives Hives and Itching  . Thiazide-Type Diuretics Other (See Comments)    Unknown  . Ciprofloxacin Rash  . Sulfonylureas Itching and Rash    Family History  Problem Relation Age of Onset  . Stroke Father   . Heart disease Mother     Prior to Admission medications   Medication Sig Start Date End Date Taking? Authorizing Provider  ADVAIR DISKUS 250-50 MCG/DOSE AEPB Inhale 1 puff into the lungs 2 (two) times daily. 09/26/15  Yes Historical Provider, MD  albuterol (PROVENTIL HFA;VENTOLIN HFA) 108 (90 BASE) MCG/ACT inhaler Inhale 2 puffs into the lungs every 6 (six) hours as needed. Shortness of breath    Yes Historical Provider, MD  aspirin EC 81 MG tablet Take 81 mg by mouth daily.     Yes Historical Provider, MD  carvedilol (COREG) 12.5 MG tablet Take 12.5 mg by mouth 2 (two) times daily.    Yes Historical Provider, MD  estradiol (ESTRACE) 2 MG tablet Take 2 mg by mouth daily.     Yes Historical Provider, MD  hydroxychloroquine (PLAQUENIL) 200 MG tablet Take 200 mg by mouth 2 (two) times daily.    Yes Historical Provider, MD  isosorbide mononitrate (IMDUR) 30 MG 24 hr tablet Take 0.5 tablets (15 mg total) by mouth daily. 10/28/14  Yes Jonelle Sidle, MD  levothyroxine (SYNTHROID, LEVOTHROID) 75 MCG tablet Take 75 mcg by mouth daily.    Yes Historical Provider, MD  lovastatin (MEVACOR) 40 MG tablet Take 40 mg by mouth  at bedtime.     Yes Historical Provider, MD  meloxicam (MOBIC) 15 MG tablet Take 15 mg by mouth daily.   Yes Historical Provider, MD  omeprazole (PRILOSEC) 40 MG capsule Take 40 mg by mouth daily.   Yes Historical Provider, MD   Physical Exam: Filed Vitals:   10/02/15 1000 10/02/15 1030 10/02/15 1116 10/02/15 1151  BP: 143/81 156/85    Pulse: 84 82    Temp:      TempSrc:      Resp: 21 24    Height:    5\' 4"  (1.626 m)  Weight:    92.534 kg (204 lb)  SpO2: 94% 95% 96%     EOMI NCAT pleasant alert but mildly increased work of breathing S1-S2 normal murmur rub or  gallop Chest wheezy posteriorly, no TVR no TVF Abdomen soft nontender nondistended no rebound no guarding obese No lower extremity pitting edema Range of motion intact able to stand however feels somewhat short of breath but with her oxygen requirement   Labs on Admission:  Basic Metabolic Panel:  Recent Labs Lab 10/02/15 0853  NA 137  K 4.2  CL 105  CO2 23  GLUCOSE 121*  BUN 9  CREATININE 0.96  CALCIUM 8.9   Liver Function Tests: No results for input(s): AST, ALT, ALKPHOS, BILITOT, PROT, ALBUMIN in the last 168 hours. No results for input(s): LIPASE, AMYLASE in the last 168 hours. No results for input(s): AMMONIA in the last 168 hours. CBC:  Recent Labs Lab 10/02/15 0853  WBC 7.6  NEUTROABS 6.3  HGB 13.4  HCT 40.1  MCV 96.4  PLT 315   Cardiac Enzymes: No results for input(s): CKTOTAL, CKMB, CKMBINDEX, TROPONINI in the last 168 hours.  BNP (last 3 results)  Recent Labs  10/02/15 0853  BNP 333.0*    ProBNP (last 3 results) No results for input(s): PROBNP in the last 8760 hours.  CBG: No results for input(s): GLUCAP in the last 168 hours.  Radiological Exams on Admission: Dg Chest 2 View  10/02/2015  CLINICAL DATA:  Shortness of breath for several days. History of asthma and COPD. EXAM: CHEST  2 VIEW COMPARISON:  Earlier today FINDINGS: Moderate cardiac enlargement. No pleural effusion or edema. No airspace consolidation. IMPRESSION: 1. Cardiac enlargement. 2. No acute findings. Electronically Signed   By: 11/30/2015 M.D.   On: 10/02/2015 11:27   Dg Chest Portable 1 View  10/02/2015  CLINICAL DATA:  60 year old female with 4- 5 day history of dry cough, and developing chest soreness related to coughing EXAM: PORTABLE CHEST 1 VIEW COMPARISON:  Prior chest x-ray 11/04/2014; prior CT scan of the chest 12/16/2014 FINDINGS: Mild cardiomegaly. There is pulmonary vascular congestion bordering on mild interstitial edema. Low inspiratory volumes. Nonspecific  bibasilar opacities which may reflect atelectasis the appearance of which is exaggerated by superimposition of overlying soft tissues. Infiltrate is difficult to exclude on a single view. No pneumothorax. No acute osseous abnormality. IMPRESSION: 1. Nonspecific right greater than left bibasilar opacities which may reflect atelectasis, infiltrates, or superimposition of overlying soft tissues. Recommend dedicated PA and lateral chest x-ray. 2. Mild cardiomegaly and pulmonary vascular congestion without overt edema. Electronically Signed   By: 12/18/2014 M.D.   On: 10/02/2015 09:03    EKG: Independently reviewed. ekg ST t wave depressions noted and compared to prior I d/w Dr. 11/30/2015 cardiology who read echo  Assessment/Plan  Acute Bronchitis, likely viral superimposed on mild COPD Admit to telemetry Nebulizers with albuterol  2.5 every 4 hourly today Switch to oral prednisone 60 mg Give  Dulera  2 puffs twice a day as a replacement for Advair Has no oxygen requirement, obs on telemetry overnight   history nonischemic cardiomyopathy, Myoview performed 10/2014 negative EKG concerning however echo has already been performed showing no wall motion abnormalities Continue Imdur 15 daily and Coreg 12.5 twice a day  observe on telemetry overnight for arrhythmia however unlikely etiology of acute coronary syndrome  ? Rheumatoid arthritis Continue hydroxychloroquine 200 twice a day Known to cause some immunosuppression Can continue Mobic 15 daily Monitor  Grade 2 compensated diastolic dysfunction, baseline NYHA class II-class III No evidence on my evaluation of fluid overload Would hold Lasix for now ongoing BNP in accurate with elevated BMI  Body mass index is 35 kg/(m^2). Needs OP management    Observation status on telemetry Full CODE STATUS Likely discharge in 1-2 days         Mahala Menghini [facility] Pager [phone]  If 7PM-7AM, please contact  night-coverage [email or web address] Password [email or web address] 10/02/2015, 11:54 AM

## 2015-10-02 NOTE — ED Provider Notes (Signed)
CSN: [id number]     Arrival date & time 10/02/15  1937 History   First MD Initiated Contact with Patient 10/02/15 0827     Chief Complaint  Patient presents with  . Shortness of Breath     (Consider location/radiation/quality/duration/timing/severity/associated sxs/prior Treatment) HPI Comments: 60 year old female with history of COPD, hypertension, CAD presents for shortness of breath. The patient reports that over the last 5 days or so she has had increasing shortness of breath as well as wheezing. She reports that she has chronically swollen lower extremities that have not changed. She denies any chest pain although she said that she has had some tightness and soreness in the chest secondary to coughing. She reports for the most part her cough has been nonproductive. She does report some nasal congestion with these symptoms and some mild headache. She has tried taking her inhalers at home without improvement.   Past Medical History  Diagnosis Date  . Arthritis   . Essential hypertension   . BOOP (bronchiolitis obliterans with organizing pneumonia) (HCC)     VDRF 2007  . Hypothyroidism   . COPD (chronic obstructive pulmonary disease) (HCC)   . CAD (coronary artery disease)     Nonobstructive at cardiac catheterization 2007  . Bilateral breast cysts     Benign  . SLE (systemic lupus erythematosus) (HCC)    Past Surgical History  Procedure Laterality Date  . Appendectomy    . Breast surgery    . Abdominal hysterectomy    . Carpal tunnel release    . Trigger finger release     Family History  Problem Relation Age of Onset  . Stroke Father   . Heart disease Mother    Social History  Substance Use Topics  . Smoking status: Former Smoker    Types: Cigarettes    Quit date: 09/24/2004  . Smokeless tobacco: None  . Alcohol Use: No   OB History    No data available     Review of Systems  Constitutional: Positive for chills and fatigue. Negative for fever and appetite  change.  HENT: Positive for congestion and sinus pressure. Negative for postnasal drip, rhinorrhea, sneezing and sore throat.   Eyes: Negative for pain, redness and visual disturbance.  Respiratory: Positive for cough, chest tightness, shortness of breath and wheezing.   Cardiovascular: Positive for leg swelling. Negative for chest pain and palpitations.  Gastrointestinal: Negative for nausea, vomiting, abdominal pain and diarrhea.  Genitourinary: Negative for dysuria, urgency and flank pain.  Musculoskeletal: Positive for myalgias. Negative for back pain.  Neurological: Positive for headaches. Negative for dizziness, weakness and light-headedness.  Hematological: Does not bruise/bleed easily.      Allergies  Levofloxacin; Sulfonamide derivatives; Thiazide-type diuretics; Ciprofloxacin; and Sulfonylureas  Home Medications   Prior to Admission medications   Medication Sig Start Date End Date Taking? Authorizing Provider  ADVAIR DISKUS 250-50 MCG/DOSE AEPB Inhale 1 puff into the lungs 2 (two) times daily. 09/26/15  Yes Historical Provider, MD  albuterol (PROVENTIL HFA;VENTOLIN HFA) 108 (90 BASE) MCG/ACT inhaler Inhale 2 puffs into the lungs every 6 (six) hours as needed. Shortness of breath    Yes Historical Provider, MD  aspirin EC 81 MG tablet Take 81 mg by mouth daily.     Yes Historical Provider, MD  carvedilol (COREG) 12.5 MG tablet Take 12.5 mg by mouth 2 (two) times daily.    Yes Historical Provider, MD  estradiol (ESTRACE) 2 MG tablet Take 2 mg by mouth daily.  Yes Historical Provider, MD  hydroxychloroquine (PLAQUENIL) 200 MG tablet Take 200 mg by mouth 2 (two) times daily.    Yes Historical Provider, MD  isosorbide mononitrate (IMDUR) 30 MG 24 hr tablet Take 0.5 tablets (15 mg total) by mouth daily. 10/28/14  Yes Jonelle Sidle, MD  levothyroxine (SYNTHROID, LEVOTHROID) 75 MCG tablet Take 75 mcg by mouth daily.    Yes Historical Provider, MD  lovastatin (MEVACOR) 40 MG tablet  Take 40 mg by mouth at bedtime.     Yes Historical Provider, MD  meloxicam (MOBIC) 15 MG tablet Take 15 mg by mouth daily.   Yes Historical Provider, MD  omeprazole (PRILOSEC) 40 MG capsule Take 40 mg by mouth daily.   Yes Historical Provider, MD   BP 146/70 mmHg  Pulse 78  Temp(Src) 98.4 F (36.9 C) (Oral)  Resp 18  Ht 5\' 4"  (1.626 m)  Wt 204 lb (92.534 kg)  BMI 35.00 kg/m2  SpO2 94% Physical Exam  Constitutional: She is oriented to person, place, and time. She appears well-developed and well-nourished. No distress.  HENT:  Head: Normocephalic and atraumatic.  Right Ear: External ear normal.  Left Ear: External ear normal.  Nose: Nose normal.  Mouth/Throat: Oropharynx is clear and moist. No oropharyngeal exudate.  Eyes: EOM are normal. Pupils are equal, round, and reactive to light.  Neck: Normal range of motion. Neck supple.  Cardiovascular: Normal rate, regular rhythm, normal heart sounds and intact distal pulses.   No murmur heard. Pulmonary/Chest: Accessory muscle usage present. Tachypnea noted. She is in respiratory distress. She has wheezes (diffuse, bilateral, symmetric). She has no rales.  Abdominal: Soft. She exhibits no distension. There is no tenderness.  Musculoskeletal: Normal range of motion. She exhibits edema (1+, bilateral, symmetric). She exhibits no tenderness.  Neurological: She is alert and oriented to person, place, and time.  Skin: Skin is warm and dry. No rash noted. She is not diaphoretic.  Vitals reviewed.   ED Course  Procedures (including critical care time)  CRITICAL CARE Performed by: Larna Daughters   Total critical care time: 30 minutes  Critical care time was exclusive of separately billable procedures and treating other patients.  Critical care was necessary to treat or prevent imminent or life-threatening deterioration.  Critical care was time spent personally by me on the following activities: development of treatment plan with patient  and/or surrogate as well as nursing, discussions with consultants, evaluation of patient's response to treatment, examination of patient, obtaining history from patient or surrogate, ordering and performing treatments and interventions, ordering and review of laboratory studies, ordering and review of radiographic studies, pulse oximetry and re-evaluation of patient's condition.  Labs Review Labs Reviewed  BASIC METABOLIC PANEL - Abnormal; Notable for the following:    Glucose, Bld 121 (*)    All other components within normal limits  BRAIN NATRIURETIC PEPTIDE - Abnormal; Notable for the following:    B Natriuretic Peptide 333.0 (*)    All other components within normal limits  CBC WITH DIFFERENTIAL/PLATELET  INFLUENZA PANEL BY PCR (TYPE A & B, H1N1)  I-STAT TROPOININ, ED    Imaging Review Dg Chest 2 View  10/02/2015  CLINICAL DATA:  Shortness of breath for several days. History of asthma and COPD. EXAM: CHEST  2 VIEW COMPARISON:  Earlier today FINDINGS: Moderate cardiac enlargement. No pleural effusion or edema. No airspace consolidation. IMPRESSION: 1. Cardiac enlargement. 2. No acute findings. Electronically Signed   By: Signa Kell M.D.   On:  10/02/2015 11:27   Dg Chest Portable 1 View  10/02/2015  CLINICAL DATA:  60 year old female with 4- 5 day history of dry cough, and developing chest soreness related to coughing EXAM: PORTABLE CHEST 1 VIEW COMPARISON:  Prior chest x-ray 11/04/2014; prior CT scan of the chest 12/16/2014 FINDINGS: Mild cardiomegaly. There is pulmonary vascular congestion bordering on mild interstitial edema. Low inspiratory volumes. Nonspecific bibasilar opacities which may reflect atelectasis the appearance of which is exaggerated by superimposition of overlying soft tissues. Infiltrate is difficult to exclude on a single view. No pneumothorax. No acute osseous abnormality. IMPRESSION: 1. Nonspecific right greater than left bibasilar opacities which may reflect  atelectasis, infiltrates, or superimposition of overlying soft tissues. Recommend dedicated PA and lateral chest x-ray. 2. Mild cardiomegaly and pulmonary vascular congestion without overt edema. Electronically Signed   By: Malachy Moan M.D.   On: 10/02/2015 09:03   I have personally reviewed and evaluated these images and lab results as part of my medical decision-making.   EKG Interpretation   Date/Time:  Sunday October 02 2015 08:32:20 EST Ventricular Rate:  86 PR Interval:  179 QRS Duration: 87 QT Interval:  439 QTC Calculation: 525 R Axis:   94 Text Interpretation:  Accelerated junctional rhythm Borderline right axis  deviation Prolonged QT interval Diffuse ST depression, ST elevation in  aVR; similar morphology on previous EKG tracings Confirmed by NGUYEN,  EMILY ([id number]) on 10/02/2015 8:56:44 AM      MDM  On initial evaluation history seemed consistent with COPD and likely respiratory infection but EKG was concerning for ischemia. Cardiology was paged. Chest x-ray, CBC, BMP, BNP, troponin were ordered.  Patient was given ASA.   8:48 AM Reviewed EKG and history with Dr. Katrinka Blazing, STEMI doctor.  At this time he said similar morphology on previous EKG and with reassuring myoperfusion study at this time he said this is not an acute MI.  He recommended serial troponins and 2D Echo.  Echo was completed in the emergency department. Chest x-ray with cardiomegaly and some bibasilar opacities. Wheezing improved with breathing treatment. Patient was also given Solu-Medrol and IV lasix. Troponin was normal. Patient was given a continuous breathing treatment for continued increased work of breathing and wheezing.  In light of respiratory status and bibasilar opacities she was given 1 initial dose of azithromycin and Rocephin in the emergency department. The hospitalist was consulted for admission. They saw the patient bedside and admitted her under their care. Patient was updated on all results  and plan of care.  Final diagnoses:  None    1. COPD exacerbation with possible pneumonia  2. CHF    Leta Baptist, MD 10/02/15 [phone]

## 2015-10-02 NOTE — ED Notes (Signed)
Pt c/o nonproductive cough and sob x 5 days.  Denies fever.

## 2015-10-03 LAB — COMPREHENSIVE METABOLIC PANEL
ALBUMIN: 3.7 g/dL (ref 3.5–5.0)
ALK PHOS: 56 U/L (ref 38–126)
ALT: 20 U/L (ref 14–54)
ANION GAP: 10 (ref 5–15)
AST: 14 U/L — ABNORMAL LOW (ref 15–41)
BUN: 16 mg/dL (ref 6–20)
CHLORIDE: 102 mmol/L (ref 101–111)
CO2: 25 mmol/L (ref 22–32)
Calcium: 8.6 mg/dL — ABNORMAL LOW (ref 8.9–10.3)
Creatinine, Ser: 0.87 mg/dL (ref 0.44–1.00)
GFR calc non Af Amer: >60 mL/min (ref >60)
GLUCOSE: 149 mg/dL — AB (ref 65–99)
Potassium: 3.6 mmol/L (ref 3.5–5.1)
SODIUM: 137 mmol/L (ref 135–145)
Total Bilirubin: 0.4 mg/dL (ref 0.3–1.2)
Total Protein: 7.3 g/dL (ref 6.5–8.1)

## 2015-10-03 LAB — CBC
HCT: 40.1 % (ref 36.0–46.0)
HEMOGLOBIN: 13.3 g/dL (ref 12.0–15.0)
MCH: 31.8 pg (ref 26.0–34.0)
MCHC: 33.2 g/dL (ref 30.0–36.0)
MCV: 95.9 fL (ref 78.0–100.0)
Platelets: 333 10*3/uL (ref 150–400)
RBC: 4.18 MIL/uL (ref 3.87–5.11)
RDW: 12.1 % (ref 11.5–15.5)
WBC: 10 10*3/uL (ref 4.0–10.5)

## 2015-10-03 LAB — PROTIME-INR
INR: 1.17 (ref 0.00–1.49)
Prothrombin Time: 15.1 seconds (ref 11.6–15.2)

## 2015-10-03 MED ORDER — HYDROCOD POLST-CPM POLST ER 10-8 MG/5ML PO SUER
5.0000 mL | Freq: Once | ORAL | Status: AC
  Administered 2015-10-03: 5 mL via ORAL
  Filled 2015-10-03: qty 5

## 2015-10-03 MED ORDER — MOMETASONE FURO-FORMOTEROL FUM 200-5 MCG/ACT IN AERO
2.0000 | INHALATION_SPRAY | Freq: Two times a day (BID) | RESPIRATORY_TRACT | Status: DC
  Administered 2015-10-03 – 2015-10-05 (×4): 2 via RESPIRATORY_TRACT
  Filled 2015-10-03: qty 8.8

## 2015-10-03 MED ORDER — HYDROCODONE-ACETAMINOPHEN 5-325 MG PO TABS
2.0000 | ORAL_TABLET | Freq: Once | ORAL | Status: AC
  Administered 2015-10-03: 2 via ORAL
  Filled 2015-10-03: qty 2

## 2015-10-03 MED ORDER — IPRATROPIUM-ALBUTEROL 0.5-2.5 (3) MG/3ML IN SOLN
3.0000 mL | RESPIRATORY_TRACT | Status: DC
  Administered 2015-10-03 – 2015-10-05 (×10): 3 mL via RESPIRATORY_TRACT
  Filled 2015-10-03 (×10): qty 3

## 2015-10-03 MED ORDER — HYDROCODONE-ACETAMINOPHEN 5-325 MG PO TABS: 1.0000 | ORAL_TABLET | ORAL | Status: DC | PRN

## 2015-10-03 MED ORDER — ALBUTEROL SULFATE (2.5 MG/3ML) 0.083% IN NEBU
2.5000 mg | INHALATION_SOLUTION | RESPIRATORY_TRACT | Status: DC
Start: 2015-10-03 — End: 2015-10-03

## 2015-10-03 MED ORDER — POLYETHYLENE GLYCOL 3350 17 G PO PACK
17.0000 g | PACK | Freq: Every day | ORAL | Status: DC
  Administered 2015-10-03 – 2015-10-05 (×3): 17 g via ORAL
  Filled 2015-10-03 (×3): qty 1

## 2015-10-03 MED ORDER — IPRATROPIUM BROMIDE 0.02 % IN SOLN: 0.5000 mg | RESPIRATORY_TRACT | Status: DC

## 2015-10-03 NOTE — Care Management Note (Signed)
Case Management Note  Patient Details  Name: Alicia Peters MRN: [id number] Date of Birth: 1956/03/13  Subjective/Objective:                  Admitted for COPD and acute bronchitis. Pt lives at home with husband. Pt has no HH services or DME's prior to admission. Pt plans to return home with self care at DC. Pt uses inhalers at home but does not have home O2 or neb tx's.   Action/Plan: ? Neb tx's at DC.   Expected Discharge Date:    10/03/2014              Expected Discharge Plan:  Home/Self Care  In-House Referral:  NA  Discharge planning Services  CM Consult  Post Acute Care Choice:  NA Choice offered to:  NA  DME Arranged:    DME Agency:     HH Arranged:    HH Agency:     Status of Service:  Completed, signed off  Medicare Important Message Given:    Date Medicare IM Given:    Medicare IM give by:    Date Additional Medicare IM Given:    Additional Medicare Important Message give by:     If discussed at Long Length of Stay Meetings, dates discussed:    Additional Comments:  Malcolm Metro, RN 10/03/2015, 1:29 PM

## 2015-10-03 NOTE — Progress Notes (Signed)
TERREN HABERLE [id number] DOB: [birth year] DOA: 10/02/2015 PCP: Abran Richard, PA-C  Brief narrative: 60 y/o ? Non osbtructive CM on Cath 2007-Lexiscan 07/2015=no ischemia LVEF 61% VDRF and BOOP 2007 Multifactorial dyspnea NYHA class ii-iii COPD-Quit smoking 1ppd for 30 yr in 2006-Baseline does not use any inhalers TAH-BSO  5 to six-day history cold + cough  exposed to son and daughter who have had similar illness recently  Emergency room workup concerning for EKG changes Echo was done and read cardiology showing no wall motion abnormalities EF 50% BUN/creatinine 9/0.9 ProBNP 333 WBC 7, hemoglobin 13, platelet 315 Influenza PCR negative Two-view chest x-ray showed cardiomegaly with no infiltrate  Past medical history-As per Problem list Chart reviewed as below- reviewed  Consultants:  none  Procedures:  none  Antibiotics:  Rocephin   Subjective   Still wheezy despite recent Rx ~ 1 hour prior with albuterol Still SOB No n/v Denies cp   Objective    Interim History:   Telemetry: Sinus arrhythmia   Objective: Filed Vitals:   10/02/15 1904 10/02/15 2100 10/03/15 0605 10/03/15 0801  BP:  157/91 118/87   Pulse:  81 78   Temp:  98.8 F (37.1 C) 97.8 F (36.6 C)   TempSrc:  Oral Oral   Resp:  18 18   Height:      Weight:      SpO2: 96% 96% 90% 95%    Intake/Output Summary (Last 24 hours) at 10/03/15 0927 Last data filed at 10/02/15 1251  Gross per 24 hour  Intake      0 ml  Output   1500 ml  Net  -1500 ml    Exam:  General: eomi-audible wheeze bedside.  Cardiovascular: s1 s2 slight tachy Respiratory:  No rales no rho Abdomen: soft nt nd no rebound Skin no le edema Neuro intact, no deficit  Data Reviewed: Basic Metabolic Panel:  Recent Labs Lab 10/02/15 0853 10/03/15 0602  NA 137 137  K 4.2 3.6  CL 105 102  CO2 23 25  GLUCOSE 121* 149*  BUN 9 16  CREATININE 0.96 0.87  CALCIUM 8.9 8.6*   Liver Function  Tests:  Recent Labs Lab 10/03/15 0602  AST 14*  ALT 20  ALKPHOS 56  BILITOT 0.4  PROT 7.3  ALBUMIN 3.7   No results for input(s): LIPASE, AMYLASE in the last 168 hours. No results for input(s): AMMONIA in the last 168 hours. CBC:  Recent Labs Lab 10/02/15 0853 10/03/15 0602  WBC 7.6 10.0  NEUTROABS 6.3  --   HGB 13.4 13.3  HCT 40.1 40.1  MCV 96.4 95.9  PLT 315 333   Cardiac Enzymes: No results for input(s): CKTOTAL, CKMB, CKMBINDEX, TROPONINI in the last 168 hours. BNP: Invalid input(s): POCBNP CBG: No results for input(s): GLUCAP in the last 168 hours.  No results found for this or any previous visit (from the past 240 hour(s)).   Studies:              All Imaging reviewed and is as per above notation   Scheduled Meds: . albuterol  2.5 mg Nebulization Q4H  . aspirin EC  81 mg Oral Daily  . carvedilol  12.5 mg Oral BID  . enoxaparin (LOVENOX) injection  40 mg Subcutaneous Q24H  . estradiol  2 mg Oral Daily  . hydroxychloroquine  200 mg Oral BID  . isosorbide mononitrate  15 mg Oral Daily  . levothyroxine  75 mcg Oral QAC breakfast  .  meloxicam  15 mg Oral Daily  . mometasone-formoterol  2 puff Inhalation BID  . pantoprazole  40 mg Oral Daily  . pravastatin  10 mg Oral q1800  . predniSONE  60 mg Oral QAC breakfast  . sodium chloride  3 mL Intravenous Q12H   Continuous Infusions:    Assessment/Plan:  Acute Bronchitis, likely viral superimposed on mild COPD Admit to telemetry Nebulizers with albuterol 2.5 every 6-->q4 scheduled for today  Switch to oral prednisone 60 mg Increase Dulera dose from 100 to 200 mg 2 puffs twice a day as a replacement for Advair Add Atrovent 0.5 q4 as well  history nonischemic cardiomyopathy, Myoview performed 10/2014 negative EKG concerning however echo has already been performed showing no wall motion abnormalities Continue Imdur 15 daily and Coreg 12.5 twice a day  ? Rheumatoid arthritis Continue hydroxychloroquine  200 twice a day Known to cause some immunosuppression Can continue Mobic 15 daily Monitor  Grade 2 compensated diastolic dysfunction, baseline NYHA class II-class III No evidence on my evaluation of fluid overload Would hold Lasix for now ongoing BNP in accurate with elevated BMI  Body mass index is 35 kg/(m^2). Needs OP management   Family Communication: d/w husband, family + Disposition Plan: expect d/c home 48 hours DVT prophylaxis: SCD Consultants: none  Pleas Koch, MD  [facility] Pager [phone] 10/03/2015, 9:27 AM    LOS: 1 day

## 2015-10-03 NOTE — Progress Notes (Signed)
Pt has ambulated in hallway several times today, independently.  Pt tolerated well.  Minimal SOB noted post ambulation.  Will continue to monitor.

## 2015-10-04 LAB — CBC WITH DIFFERENTIAL/PLATELET
Basophils Absolute: 0 10*3/uL (ref 0.0–0.1)
Basophils Relative: 0 %
EOS ABS: 0 10*3/uL (ref 0.0–0.7)
EOS PCT: 0 %
HCT: 40.5 % (ref 36.0–46.0)
HEMOGLOBIN: 13.4 g/dL (ref 12.0–15.0)
LYMPHS ABS: 1.6 10*3/uL (ref 0.7–4.0)
Lymphocytes Relative: 10 %
MCH: 32.1 pg (ref 26.0–34.0)
MCHC: 33.1 g/dL (ref 30.0–36.0)
MCV: 96.9 fL (ref 78.0–100.0)
MONOS PCT: 8 %
Monocytes Absolute: 1.3 10*3/uL — ABNORMAL HIGH (ref 0.1–1.0)
Neutro Abs: 13.6 10*3/uL — ABNORMAL HIGH (ref 1.7–7.7)
Neutrophils Relative %: 82 %
PLATELETS: 363 10*3/uL (ref 150–400)
RBC: 4.18 MIL/uL (ref 3.87–5.11)
RDW: 12.2 % (ref 11.5–15.5)
WBC: 16.6 10*3/uL — ABNORMAL HIGH (ref 4.0–10.5)

## 2015-10-04 LAB — COMPREHENSIVE METABOLIC PANEL
ALK PHOS: 51 U/L (ref 38–126)
ALT: 19 U/L (ref 14–54)
ANION GAP: 9 (ref 5–15)
AST: 16 U/L (ref 15–41)
Albumin: 3.5 g/dL (ref 3.5–5.0)
BUN: 23 mg/dL — ABNORMAL HIGH (ref 6–20)
CALCIUM: 8.7 mg/dL — AB (ref 8.9–10.3)
CO2: 26 mmol/L (ref 22–32)
Chloride: 102 mmol/L (ref 101–111)
Creatinine, Ser: 0.87 mg/dL (ref 0.44–1.00)
Glucose, Bld: 113 mg/dL — ABNORMAL HIGH (ref 65–99)
Potassium: 3.9 mmol/L (ref 3.5–5.1)
SODIUM: 137 mmol/L (ref 135–145)
Total Bilirubin: 0.6 mg/dL (ref 0.3–1.2)
Total Protein: 6.7 g/dL (ref 6.5–8.1)

## 2015-10-04 MED ORDER — ZOLPIDEM TARTRATE 5 MG PO TABS
5.0000 mg | ORAL_TABLET | Freq: Once | ORAL | Status: AC
  Administered 2015-10-04: 5 mg via ORAL
  Filled 2015-10-04: qty 1

## 2015-10-04 MED ORDER — AEROCHAMBER PLUS FLO-VU MEDIUM MISC
1.0000 | Freq: Once | Status: AC
  Administered 2015-10-04: 1
  Filled 2015-10-04: qty 1

## 2015-10-04 MED ORDER — HYDROCOD POLST-CPM POLST ER 10-8 MG/5ML PO SUER
5.0000 mL | Freq: Once | ORAL | Status: AC
  Administered 2015-10-04: 5 mL via ORAL
  Filled 2015-10-04: qty 5

## 2015-10-04 MED ORDER — ALBUTEROL SULFATE HFA 108 (90 BASE) MCG/ACT IN AERS
2.0000 | INHALATION_SPRAY | RESPIRATORY_TRACT | Status: DC
  Filled 2015-10-04: qty 6.7

## 2015-10-04 NOTE — Progress Notes (Signed)
Alicia Peters [id number] DOB: July 10, 1956 DOA: 10/02/2015 PCP: Abran Richard, PA-C  Brief narrative:  60 y/o ? Non osbtructive CM on Cath 2007-Lexiscan 07/2015=no ischemia LVEF 61% VDRF and BOOP 2007 Multifactorial dyspnea NYHA class ii-iii COPD-Quit smoking 1ppd for 30 yr in 2006-Baseline does not use any inhalers TAH-BSO  5 to six-day history cold + cough  exposed to son and daughter who have had similar illness recently  Emergency room workup concerning for EKG changes Echo was done and read cardiology showing no wall motion abnormalities EF 50% BUN/creatinine 9/0.9 ProBNP 333 WBC 7, hemoglobin 13, platelet 315 Influenza PCR negative Two-view chest x-ray showed cardiomegaly with no infiltrate  Past medical history-As per Problem list Chart reviewed as below- reviewed  Consultants:  none  Procedures:  none  Antibiotics:  Rocephin   Subjective   Alert pleasant Less wheezy Feels better overall Slight Cp with cough No n/v     Objective    Interim History:   Telemetry: Sinus arrhythmia   Objective: Filed Vitals:   10/03/15 2027 10/03/15 2323 10/04/15 0510 10/04/15 0754  BP: 170/80  155/74   Pulse: 79  70   Temp: 97.9 F (36.6 C)  98 F (36.7 C)   TempSrc: Oral  Oral   Resp: 18  18   Height:      Weight:      SpO2: 96% 94% 99% 98%    Intake/Output Summary (Last 24 hours) at 10/04/15 1120 Last data filed at 10/04/15 0900  Gross per 24 hour  Intake    720 ml  Output   1550 ml  Net   -830 ml    Exam:  General: eomi Cardiovascular: s1 s2 slight tachy Respiratory:  Mild wheeze Abdomen: soft nt nd no rebound Skin no le edema Neuro intact, no deficit  Data Reviewed: Basic Metabolic Panel:  Recent Labs Lab 10/02/15 0853 10/03/15 0602 10/04/15 0543  NA 137 137 137  K 4.2 3.6 3.9  CL 105 102 102  CO2 23 25 26   GLUCOSE 121* 149* 113*  BUN 9 16 23*  CREATININE 0.96 0.87 0.87  CALCIUM 8.9 8.6* 8.7*   Liver Function  Tests:  Recent Labs Lab 10/03/15 0602 10/04/15 0543  AST 14* 16  ALT 20 19  ALKPHOS 56 51  BILITOT 0.4 0.6  PROT 7.3 6.7  ALBUMIN 3.7 3.5   No results for input(s): LIPASE, AMYLASE in the last 168 hours. No results for input(s): AMMONIA in the last 168 hours. CBC:  Recent Labs Lab 10/02/15 0853 10/03/15 0602 10/04/15 0543  WBC 7.6 10.0 16.6*  NEUTROABS 6.3  --  13.6*  HGB 13.4 13.3 13.4  HCT 40.1 40.1 40.5  MCV 96.4 95.9 96.9  PLT 315 333 363   Cardiac Enzymes: No results for input(s): CKTOTAL, CKMB, CKMBINDEX, TROPONINI in the last 168 hours. BNP: Invalid input(s): POCBNP CBG: No results for input(s): GLUCAP in the last 168 hours.  No results found for this or any previous visit (from the past 240 hour(s)).   Studies:              All Imaging reviewed and is as per above notation   Scheduled Meds: . aspirin EC  81 mg Oral Daily  . carvedilol  12.5 mg Oral BID  . enoxaparin (LOVENOX) injection  40 mg Subcutaneous Q24H  . estradiol  2 mg Oral Daily  . hydroxychloroquine  200 mg Oral BID  . ipratropium-albuterol  3 mL Nebulization Q4H  .  isosorbide mononitrate  15 mg Oral Daily  . levothyroxine  75 mcg Oral QAC breakfast  . meloxicam  15 mg Oral Daily  . mometasone-formoterol  2 puff Inhalation BID  . pantoprazole  40 mg Oral Daily  . polyethylene glycol  17 g Oral Daily  . pravastatin  10 mg Oral q1800  . predniSONE  60 mg Oral QAC breakfast  . sodium chloride  3 mL Intravenous Q12H   Continuous Infusions:    Assessment/Plan:  Acute Bronchitis, likely viral superimposed on mild COPD Admit to telemetry Nebulizers with albuterol 2.5 every 6-->q4 scheduled for today  I have asked respiratory to teach method of Inhaler use with Aerochamber Switch to oral prednisone 60 mg Increase Dulera dose from 100 to 200 mg 2 puffs twice a day as a replacement for Advair Add Atrovent 0.5 q4 as well Some improvement but still wheezy today Likely can d/c home in  am  history nonischemic cardiomyopathy, Myoview performed 10/2014 negative EKG concerning however echo has already been performed showing no wall motion abnormalities Continue Imdur 15 daily and Coreg 12.5 twice a day  ? Rheumatoid arthritis Continue hydroxychloroquine 200 twice a day. Known to cause some immunosuppression. Can continue Mobic 15 daily. Monitor.  Grade 2 compensated diastolic dysfunction, baseline NYHA class II-class III. No evidence on my evaluation of fluid overload. Would hold Lasix for now ongoing-resume on d/c home BNP inaccurate with elevated BMI.  Body mass index is 35 kg/(m^2). Needs OP management.   Family Communication: d/w husband, family + Disposition Plan: expect d/c home 10/04/15 if all well Respiratory aware to teach aerochamber and specer use on d/c DVT prophylaxis: SCD Consultants: none  Pleas Koch, MD  [facility] Pager [phone] 10/04/2015, 11:20 AM    LOS: 2 days

## 2015-10-05 DIAGNOSIS — I10 Essential (primary) hypertension: Secondary | ICD-10-CM

## 2015-10-05 DIAGNOSIS — J441 Chronic obstructive pulmonary disease with (acute) exacerbation: Secondary | ICD-10-CM | POA: Diagnosis present

## 2015-10-05 DIAGNOSIS — M069 Rheumatoid arthritis, unspecified: Secondary | ICD-10-CM | POA: Diagnosis present

## 2015-10-05 DIAGNOSIS — E039 Hypothyroidism, unspecified: Secondary | ICD-10-CM | POA: Diagnosis present

## 2015-10-05 DIAGNOSIS — K219 Gastro-esophageal reflux disease without esophagitis: Secondary | ICD-10-CM

## 2015-10-05 DIAGNOSIS — J440 Chronic obstructive pulmonary disease with acute lower respiratory infection: Principal | ICD-10-CM

## 2015-10-05 MED ORDER — DOXYCYCLINE HYCLATE 100 MG PO CAPS: 100.0000 mg | ORAL_CAPSULE | Freq: Two times a day (BID) | ORAL | Status: AC

## 2015-10-05 MED ORDER — PREDNISONE 10 MG PO TABS: ORAL_TABLET | ORAL | Status: AC

## 2015-10-05 MED ORDER — GUAIFENESIN-DM 100-10 MG/5ML PO SYRP: 5.0000 mL | ORAL_SOLUTION | ORAL | Status: AC | PRN

## 2015-10-05 NOTE — Care Management Note (Signed)
Case Management Note  Patient Details  Name: Alicia Peters MRN: [id number] Date of Birth: [birth year]  Expected Discharge Date:                  Expected Discharge Plan:  Home/Self Care  In-House Referral:  NA  Discharge planning Services  CM Consult  Post Acute Care Choice:  NA Choice offered to:  NA  DME Arranged:    DME Agency:     HH Arranged:    HH Agency:     Status of Service:  Completed, signed off  Medicare Important Message Given:  Yes Date Medicare IM Given:    Medicare IM give by:    Date Additional Medicare IM Given:    Additional Medicare Important Message give by:     If discussed at Long Length of Stay Meetings, dates discussed:    Additional Comments: Pt discharging home with self care today. No CM needs.  Malcolm Metro, RN 10/05/2015, 3:18 PM

## 2015-10-05 NOTE — Progress Notes (Signed)
Patient with orders to be discharge home. Discharge instructions given, patient verbalized understanding. Patient stable. Patient left in private vehicle with family.  

## 2015-10-05 NOTE — Care Management Important Message (Signed)
Important Message  Patient Details  Name: Alicia Peters MRN: [id number] Date of Birth: [birth year]   Medicare Important Message Given:  Yes    Malcolm Metro, RN 10/05/2015, 3:17 PM

## 2015-10-05 NOTE — Progress Notes (Signed)
**Note De-Identified Alicia Peters Obfuscation** Patient education om albuterol MDI and spacer  complete

## 2015-10-05 NOTE — Discharge Summary (Signed)
Physician Discharge Summary  Alicia Peters TTS:[id number] DOB: 1956/09/13 DOA: 10/02/2015  PCP: Abran Richard, PA-C  Admit date: 10/02/2015 Discharge date: 10/05/2015  Time spent: 35  minutes  Recommendations for Outpatient Follow-up:  1. Follow up with PCP in 1-2 weeks. 2. Follow up with pulmonology within 2-3 weeks.    Discharge Diagnoses:  Active Problems:   Essential hypertension, benign   COPD with acute bronchitis (HCC)   COPD exacerbation (HCC)   Hypothyroidism   GERD (gastroesophageal reflux disease)   RA (rheumatoid arthritis) (HCC)   Discharge Condition: Improved  Diet recommendation: Heart healthy  Filed Weights   10/02/15 0827 10/02/15 1151  Weight: 92.534 kg (204 lb) 92.534 kg (204 lb)    History of present illness:  60 y/o female with a hx of COPD and HTN presented with 4-5 day hx of cough and congestion. While in the ED, CXR and labs were unremarkable. She was admitted for further management.   Hospital Course:  Patient presented with a cough, found to have acute bronchitis superimposed on a COPD exacerbation. She was placed on bronchodilators and steroids with significant improvement. Her SOB has resolved but she remains with a productive cough. She is afebrile with a normal WBC. She will be discharged on a course of abx, steroid taper, and antitussives. She is encouraged to continue her home albuterol inhaler as needed.  1. Hx of nonischemic cardiomyopathy. Myoview done 2/16 was negative.  2. RA. Continue current medications 3. Chronic diastolic heart failure, appears compensated. ECHO as below. 4. Essential HTN, stable. Continue home meds. 5. GERD, continue Omeprazole.  6. Hypothyroidism, continue Synthroid.   Consultants:  none  Procedures:  ECHO Study Conclusions  - Left ventricle: The cavity size was normal. Wall thickness was normal. Systolic function was normal. The estimated ejection fraction was in the range of 55% to 60%. Wall  motion was normal; there were no regional wall motion abnormalities. Features are consistent with a pseudonormal left ventricular filling pattern, with concomitant abnormal relaxation and increased filling pressure (grade 2 diastolic dysfunction). - Aortic valve: Trileaflet; mildly thickened, mildly calcified leaflets. - Tricuspid valve: There was moderate regurgitation. - Pulmonary arteries: Systolic pressure was moderately increased. PA peak pressure: 48 mm Hg (S).   Discharge Exam: Filed Vitals:   10/05/15 0554 10/05/15 1421  BP: 172/76 160/82  Pulse: 66 72  Temp: 98.1 F (36.7 C) 98.1 F (36.7 C)  Resp: 20 20     General: NAD, looks comfortable  Cardiovascular: RRR, S1, S2   Respiratory: Few wheezes bilaterally  Abdomen: soft, non tender, no distention , bowel sounds normal  Musculoskeletal: No edema b/l   Discharge Instructions   Discharge Instructions    Diet - low sodium heart healthy    Complete by:  As directed      Increase activity slowly    Complete by:  As directed           Current Discharge Medication List    START taking these medications   Details  doxycycline (VIBRAMYCIN) 100 MG capsule Take 1 capsule (100 mg total) by mouth 2 (two) times daily. Qty: 10 capsule, Refills: 0    guaiFENesin-dextromethorphan (ROBITUSSIN DM) 100-10 MG/5ML syrup Take 5 mLs by mouth every 4 (four) hours as needed for cough. Qty: 118 mL, Refills: 0    predniSONE (DELTASONE) 10 MG tablet Take 40mg  po daily for 2 days then 30mg  daily for 2 days then 20mg  daily for 2 days then 10mg  daily for 2  days then stop Qty: 20 tablet, Refills: 0      CONTINUE these medications which have NOT CHANGED   Details  ADVAIR DISKUS 250-50 MCG/DOSE AEPB Inhale 1 puff into the lungs 2 (two) times daily.    albuterol (PROVENTIL HFA;VENTOLIN HFA) 108 (90 BASE) MCG/ACT inhaler Inhale 2 puffs into the lungs every 6 (six) hours as needed. Shortness of breath     aspirin EC  81 MG tablet Take 81 mg by mouth daily.      carvedilol (COREG) 12.5 MG tablet Take 12.5 mg by mouth 2 (two) times daily.     estradiol (ESTRACE) 2 MG tablet Take 2 mg by mouth daily.      hydroxychloroquine (PLAQUENIL) 200 MG tablet Take 200 mg by mouth 2 (two) times daily.     isosorbide mononitrate (IMDUR) 30 MG 24 hr tablet Take 0.5 tablets (15 mg total) by mouth daily. Qty: 15 tablet, Refills: 3    levothyroxine (SYNTHROID, LEVOTHROID) 75 MCG tablet Take 75 mcg by mouth daily.     lovastatin (MEVACOR) 40 MG tablet Take 40 mg by mouth at bedtime.      meloxicam (MOBIC) 15 MG tablet Take 15 mg by mouth daily.    omeprazole (PRILOSEC) 40 MG capsule Take 40 mg by mouth daily.       Allergies  Allergen Reactions  . Levofloxacin Other (See Comments)    Allergy unknown to patient.   . Sulfonamide Derivatives Hives and Itching  . Thiazide-Type Diuretics Other (See Comments)    Unknown  . Ciprofloxacin Rash  . Sulfonylureas Itching and Rash      The results of significant diagnostics from this hospitalization (including imaging, microbiology, ancillary and laboratory) are listed below for reference.    Significant Diagnostic Studies: Dg Chest 2 View  10/02/2015  CLINICAL DATA:  Shortness of breath for several days. History of asthma and COPD. EXAM: CHEST  2 VIEW COMPARISON:  Earlier today FINDINGS: Moderate cardiac enlargement. No pleural effusion or edema. No airspace consolidation. IMPRESSION: 1. Cardiac enlargement. 2. No acute findings. Electronically Signed   By: Signa Kell M.D.   On: 10/02/2015 11:27   Dg Chest Portable 1 View  10/02/2015  CLINICAL DATA:  60 year old female with 4- 5 day history of dry cough, and developing chest soreness related to coughing EXAM: PORTABLE CHEST 1 VIEW COMPARISON:  Prior chest x-ray 11/04/2014; prior CT scan of the chest 12/16/2014 FINDINGS: Mild cardiomegaly. There is pulmonary vascular congestion bordering on mild interstitial edema.  Low inspiratory volumes. Nonspecific bibasilar opacities which may reflect atelectasis the appearance of which is exaggerated by superimposition of overlying soft tissues. Infiltrate is difficult to exclude on a single view. No pneumothorax. No acute osseous abnormality. IMPRESSION: 1. Nonspecific right greater than left bibasilar opacities which may reflect atelectasis, infiltrates, or superimposition of overlying soft tissues. Recommend dedicated PA and lateral chest x-ray. 2. Mild cardiomegaly and pulmonary vascular congestion without overt edema. Electronically Signed   By: Malachy Moan M.D.   On: 10/02/2015 09:03     Labs: Basic Metabolic Panel:  Recent Labs Lab 10/02/15 0853 10/03/15 0602 10/04/15 0543  NA 137 137 137  K 4.2 3.6 3.9  CL 105 102 102  CO2 23 25 26   GLUCOSE 121* 149* 113*  BUN 9 16 23*  CREATININE 0.96 0.87 0.87  CALCIUM 8.9 8.6* 8.7*   Liver Function Tests:  Recent Labs Lab 10/03/15 0602 10/04/15 0543  AST 14* 16  ALT 20 19  ALKPHOS 56  51  BILITOT 0.4 0.6  PROT 7.3 6.7  ALBUMIN 3.7 3.5    CBC:  Recent Labs Lab 10/02/15 0853 10/03/15 0602 10/04/15 0543  WBC 7.6 10.0 16.6*  NEUTROABS 6.3  --  13.6*  HGB 13.4 13.3 13.4  HCT 40.1 40.1 40.5  MCV 96.4 95.9 96.9  PLT 315 333 363    BNP: BNP (last 3 results)  Recent Labs  10/02/15 0853  BNP 333.0*    Signed: Moustapha Tooker. MD [facility] 10/05/2015, 2:25 PM    By signing my name below, I, Burnett Harry, attest that this documentation has been prepared under the direction and in the presence of [facility]. MD Electronically Signed: Burnett Harry, Scribe. 10/05/2015  I, Dr. Erick Blinks, personally performed the services described in this documentaiton. All medical record entries made by the scribe were at my direction and in my presence. I have reviewed the chart and agree that the record reflects my personal performance and is accurate and complete  Erick Blinks, MD, 10/05/2015 2:25 PM

## 2015-12-29 ENCOUNTER — Emergency Department (HOSPITAL_COMMUNITY)
Admission: EM | Admit: 2015-12-29 | Discharge: 2015-12-29 | Disposition: A | Discharge status: Home / Self Care | Payer: Medicare HMO | Attending: Emergency Medicine | Admitting: Emergency Medicine

## 2015-12-29 ENCOUNTER — Encounter (HOSPITAL_COMMUNITY): Payer: Self-pay

## 2015-12-29 DIAGNOSIS — J449 Chronic obstructive pulmonary disease, unspecified: Secondary | ICD-10-CM | POA: Diagnosis not present

## 2015-12-29 DIAGNOSIS — Z79899 Other long term (current) drug therapy: Secondary | ICD-10-CM | POA: Insufficient documentation

## 2015-12-29 DIAGNOSIS — I10 Essential (primary) hypertension: Secondary | ICD-10-CM | POA: Insufficient documentation

## 2015-12-29 DIAGNOSIS — I2510 Atherosclerotic heart disease of native coronary artery without angina pectoris: Secondary | ICD-10-CM | POA: Insufficient documentation

## 2015-12-29 DIAGNOSIS — Z7982 Long term (current) use of aspirin: Secondary | ICD-10-CM | POA: Diagnosis not present

## 2015-12-29 DIAGNOSIS — M1990 Unspecified osteoarthritis, unspecified site: Secondary | ICD-10-CM | POA: Diagnosis not present

## 2015-12-29 DIAGNOSIS — E039 Hypothyroidism, unspecified: Secondary | ICD-10-CM | POA: Insufficient documentation

## 2015-12-29 DIAGNOSIS — Z87891 Personal history of nicotine dependence: Secondary | ICD-10-CM | POA: Insufficient documentation

## 2015-12-29 LAB — CBC WITH DIFFERENTIAL/PLATELET
Basophils Absolute: 0 K/uL (ref 0.0–0.1)
Basophils Relative: 0 %
Eosinophils Absolute: 0.3 K/uL (ref 0.0–0.7)
Eosinophils Relative: 4 %
HCT: 40.6 % (ref 36.0–46.0)
Hemoglobin: 13.6 g/dL (ref 12.0–15.0)
Lymphocytes Relative: 39 %
Lymphs Abs: 2.7 K/uL (ref 0.7–4.0)
MCH: 32 pg (ref 26.0–34.0)
MCHC: 33.5 g/dL (ref 30.0–36.0)
MCV: 95.5 fL (ref 78.0–100.0)
Monocytes Absolute: 0.4 K/uL (ref 0.1–1.0)
Monocytes Relative: 6 %
Neutro Abs: 3.5 K/uL (ref 1.7–7.7)
Neutrophils Relative %: 51 %
Platelets: 339 K/uL (ref 150–400)
RBC: 4.25 MIL/uL (ref 3.87–5.11)
RDW: 12.3 % (ref 11.5–15.5)
WBC: 6.9 K/uL (ref 4.0–10.5)

## 2015-12-29 LAB — BASIC METABOLIC PANEL WITH GFR
Anion gap: 7 (ref 5–15)
BUN: 13 mg/dL (ref 6–20)
CO2: 23 mmol/L (ref 22–32)
Calcium: 8.9 mg/dL (ref 8.9–10.3)
Chloride: 109 mmol/L (ref 101–111)
Creatinine, Ser: 0.74 mg/dL (ref 0.44–1.00)
GFR calc Af Amer: >60 mL/min
GFR calc non Af Amer: >60 mL/min
Glucose, Bld: 92 mg/dL (ref 65–99)
Potassium: 4.1 mmol/L (ref 3.5–5.1)
Sodium: 139 mmol/L (ref 135–145)

## 2015-12-29 NOTE — ED Provider Notes (Signed)
CSN: [id number]     Arrival date & time 12/29/15  1256 History   First MD Initiated Contact with Patient 12/29/15 1439     Chief Complaint  Patient presents with  . Hypertension     (Consider location/radiation/quality/duration/timing/severity/associated sxs/prior Treatment) Patient is a 60 y.o. female presenting with hypertension. The history is provided by the patient (The patient complains of having her blood pressure elevated. She called her doctor decided to come to the emergency department).  Hypertension This is a recurrent problem. The current episode started more than 2 days ago. The problem occurs constantly. The problem has not changed since onset.Pertinent negatives include no chest pain, no abdominal pain and no headaches. Nothing aggravates the symptoms.    Past Medical History  Diagnosis Date  . Arthritis   . Essential hypertension   . BOOP (bronchiolitis obliterans with organizing pneumonia) (HCC)     VDRF 2007  . Hypothyroidism   . COPD (chronic obstructive pulmonary disease) (HCC)   . CAD (coronary artery disease)     Nonobstructive at cardiac catheterization 2007  . Bilateral breast cysts     Benign  . SLE (systemic lupus erythematosus) (HCC)    Past Surgical History  Procedure Laterality Date  . Appendectomy    . Breast surgery    . Abdominal hysterectomy    . Carpal tunnel release    . Trigger finger release     Family History  Problem Relation Age of Onset  . Stroke Father   . Heart disease Mother    Social History  Substance Use Topics  . Smoking status: Former Smoker    Types: Cigarettes    Quit date: 09/24/2004  . Smokeless tobacco: None  . Alcohol Use: No   OB History    No data available     Review of Systems  Constitutional: Negative for appetite change and fatigue.  HENT: Negative for congestion, ear discharge and sinus pressure.   Eyes: Negative for discharge.  Respiratory: Negative for cough.   Cardiovascular: Negative for chest  pain.  Gastrointestinal: Negative for abdominal pain and diarrhea.  Genitourinary: Negative for frequency and hematuria.  Musculoskeletal: Negative for back pain.  Skin: Negative for rash.  Neurological: Negative for seizures and headaches.  Psychiatric/Behavioral: Negative for hallucinations.      Allergies  Levofloxacin; Sulfonamide derivatives; Thiazide-type diuretics; Ciprofloxacin; and Sulfonylureas  Home Medications   Prior to Admission medications   Medication Sig Start Date End Date Taking? Authorizing Provider  ADVAIR DISKUS 250-50 MCG/DOSE AEPB Inhale 1 puff into the lungs 2 (two) times daily. 09/26/15  Yes Historical Provider, MD  albuterol (PROVENTIL HFA;VENTOLIN HFA) 108 (90 BASE) MCG/ACT inhaler Inhale 2 puffs into the lungs every 6 (six) hours as needed. Shortness of breath    Yes Historical Provider, MD  aspirin EC 81 MG tablet Take 81 mg by mouth daily.     Yes Historical Provider, MD  carvedilol (COREG) 12.5 MG tablet Take 12.5 mg by mouth 2 (two) times daily.    Yes Historical Provider, MD  estradiol (ESTRACE) 2 MG tablet Take 2 mg by mouth daily.     Yes Historical Provider, MD  hydroxychloroquine (PLAQUENIL) 200 MG tablet Take 200 mg by mouth 2 (two) times daily.    Yes Historical Provider, MD  isosorbide mononitrate (IMDUR) 30 MG 24 hr tablet Take 0.5 tablets (15 mg total) by mouth daily. 10/28/14  Yes Jonelle Sidle, MD  levothyroxine (SYNTHROID, LEVOTHROID) 75 MCG tablet Take 75 mcg by mouth  daily.    Yes Historical Provider, MD  lovastatin (MEVACOR) 40 MG tablet Take 40 mg by mouth at bedtime.     Yes Historical Provider, MD  meloxicam (MOBIC) 15 MG tablet Take 15 mg by mouth daily.   Yes Historical Provider, MD  omeprazole (PRILOSEC) 40 MG capsule Take 40 mg by mouth daily.   Yes Historical Provider, MD  doxycycline (VIBRAMYCIN) 100 MG capsule Take 1 capsule (100 mg total) by mouth 2 (two) times daily. Patient not taking: Reported on 12/29/2015 10/05/15   Erick Blinks, MD  guaiFENesin-dextromethorphan (ROBITUSSIN DM) 100-10 MG/5ML syrup Take 5 mLs by mouth every 4 (four) hours as needed for cough. Patient not taking: Reported on 12/29/2015 10/05/15   Erick Blinks, MD  predniSONE (DELTASONE) 10 MG tablet Take 40mg  po daily for 2 days then 30mg  daily for 2 days then 20mg  daily for 2 days then 10mg  daily for 2 days then stop Patient not taking: Reported on 12/29/2015 10/05/15   , MD   BP 182/92 mmHg  Pulse 62  Temp(Src) 98.2 F (36.8 C) (Oral)  Resp 16  Ht 5\' 4"  (1.626 m)  Wt 201 lb (91.173 kg)  BMI 34.48 kg/m2  SpO2 100% Physical Exam  Constitutional: She is oriented to person, place, and time. She appears well-developed.  HENT:  Head: Normocephalic.  Eyes: Conjunctivae and EOM are normal. No scleral icterus.  Neck: Neck supple. No thyromegaly present.  Cardiovascular: Normal rate and regular rhythm.  Exam reveals no gallop and no friction rub.   No murmur heard. Pulmonary/Chest: No stridor. She has no wheezes. She has no rales. She exhibits no tenderness.  Abdominal: She exhibits no distension. There is no tenderness. There is no rebound.  Musculoskeletal: Normal range of motion. She exhibits no edema.  Lymphadenopathy:    She has no cervical adenopathy.  Neurological: She is oriented to person, place, and time. She exhibits normal muscle tone. Coordination normal.  Skin: No rash noted. No erythema.  Psychiatric: She has a normal mood and affect. Her behavior is normal.    ED Course  Procedures (including critical care time) Labs Review Labs Reviewed  CBC WITH DIFFERENTIAL/PLATELET  BASIC METABOLIC PANEL    Imaging Review No results found. I have personally reviewed and evaluated these images and lab results as part of my medical decision-making.   EKG Interpretation None      MDM   Final diagnoses:  Essential hypertension    Patient with hypertension. Labs unremarkable. She was instructed to increase her  blood pressure medicine to 25 mg twice a day   , MD 12/29/15 1740

## 2015-12-29 NOTE — Discharge Instructions (Signed)
Increase your coreg bp medicine to two pills twice a day.

## 2015-12-29 NOTE — ED Notes (Signed)
Pt states she does have a mild headache, rates pain 5/10. Pt states she always has this type of headache. Pt has new area of pain to her right posterior head. NAD noted. No weakness or deficits. NIH 0.

## 2015-12-29 NOTE — ED Notes (Signed)
Pt reports HTN over the past 3 days. States BP was 174/102 this morning. Pt has been compliant with BP meds. States PCP told her to come to ED. Pt denies HA or vision changes. AOx4.

## 2016-01-22 IMAGING — NM NM MYOCAR MULTI W/SPECT W/WALL MOTION & EF
2 series · 12 of 12 positions shown · non-contrast
Comparison: None.

CLINICAL DATA: 58-year-old male with a history of nonobstructive
coronary artery disease referred for shortness of breath.

EXAM:
MYOCARDIAL IMAGING WITH SPECT (REST AND PHARMACOLOGIC-STRESS)
GATED LEFT VENTRICULAR WALL MOTION STUDY
LEFT VENTRICULAR EJECTION FRACTION
TECHNIQUE: Standard myocardial SPECT imaging was performed after resting
intravenous injection of 10 mCi Mc-FFm sestamibi. Subsequently,
intravenous infusion of Lexiscan was performed under the supervision
of the Cardiology staff. At peak effect of the drug, 30 mCi Mc-FFm
sestamibi was injected intravenously and standard myocardial SPECT
imaging was performed. Quantitative gated imaging was also performed
to evaluate left ventricular wall motion, and estimate left
ventricular ejection fraction.

[Series 1: rest · 8.28mm/px · 6 of 64 frames shown]
[frame 6/64]
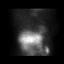
[frame 16/64]
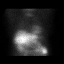
[frame 27/64]
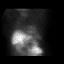
[frame 38/64]
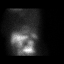
[frame 48/64]
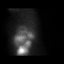
[frame 59/64]
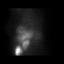

[Series 3: stress gated - perfusion · 8.28mm/px · 6 of 64 frames shown]
[frame 6/64]
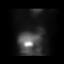
[frame 16/64]
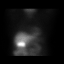
[frame 27/64]
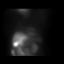
[frame 38/64]
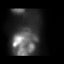
[frame 48/64]
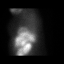
[frame 59/64]
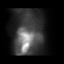

[12 of 12 positions shown; findings below may reference images not displayed]

FINDINGS: Pharmacological stress

Baseline EKG shows sinus rhythm with marked ST depressions in the
inferior limb and lateral precordial leads. After injection heart
rate increased from 59 beats per min up to 93 beats per min and
blood pressure decreased from 147/79 down to 139/80. The test was
stopped after the injection was complete, the patient did not
experience any chest pain. Post-injection EKG showed persistence of
baseline ST/T changes. No new ischemic changes and no significant
arrhythmias.

Perfusion: There is a small mild intensity apical defect seen in the
resting images. A similar but much less intense defect is seen in
the post-injection images. The apex has normal wall motion. Findings
consistent with apical thinning.

Wall Motion: Normal left ventricular wall motion. No left
ventricular dilation.

Left Ventricular Ejection Fraction: 61 %

End diastolic volume 65 ml

End systolic volume 26 ml
IMPRESSION: 1. No reversible ischemia or infarction.

2. Normal left ventricular wall motion.

3. Left ventricular ejection fraction 61%

4. Low-risk stress test findings*.

*8138 Appropriate Use Criteria for Coronary Revascularization
Focused Update: J Am Coll Cardiol. 8138;59(9):857-881.
[URL]

## 2016-01-23 IMAGING — CR DG CHEST 2V
2 series · 2 of 2 positions shown · non-contrast
Comparison: PA and lateral chest x-ray November 29, 2013

CLINICAL DATA: Two months of nonproductive cough and shortness of
breath; history of COPD, BOOP, and CHF; discontinued smoking in 6777

EXAM:
CHEST  2 VIEW

[view not recorded (1 of 2)]
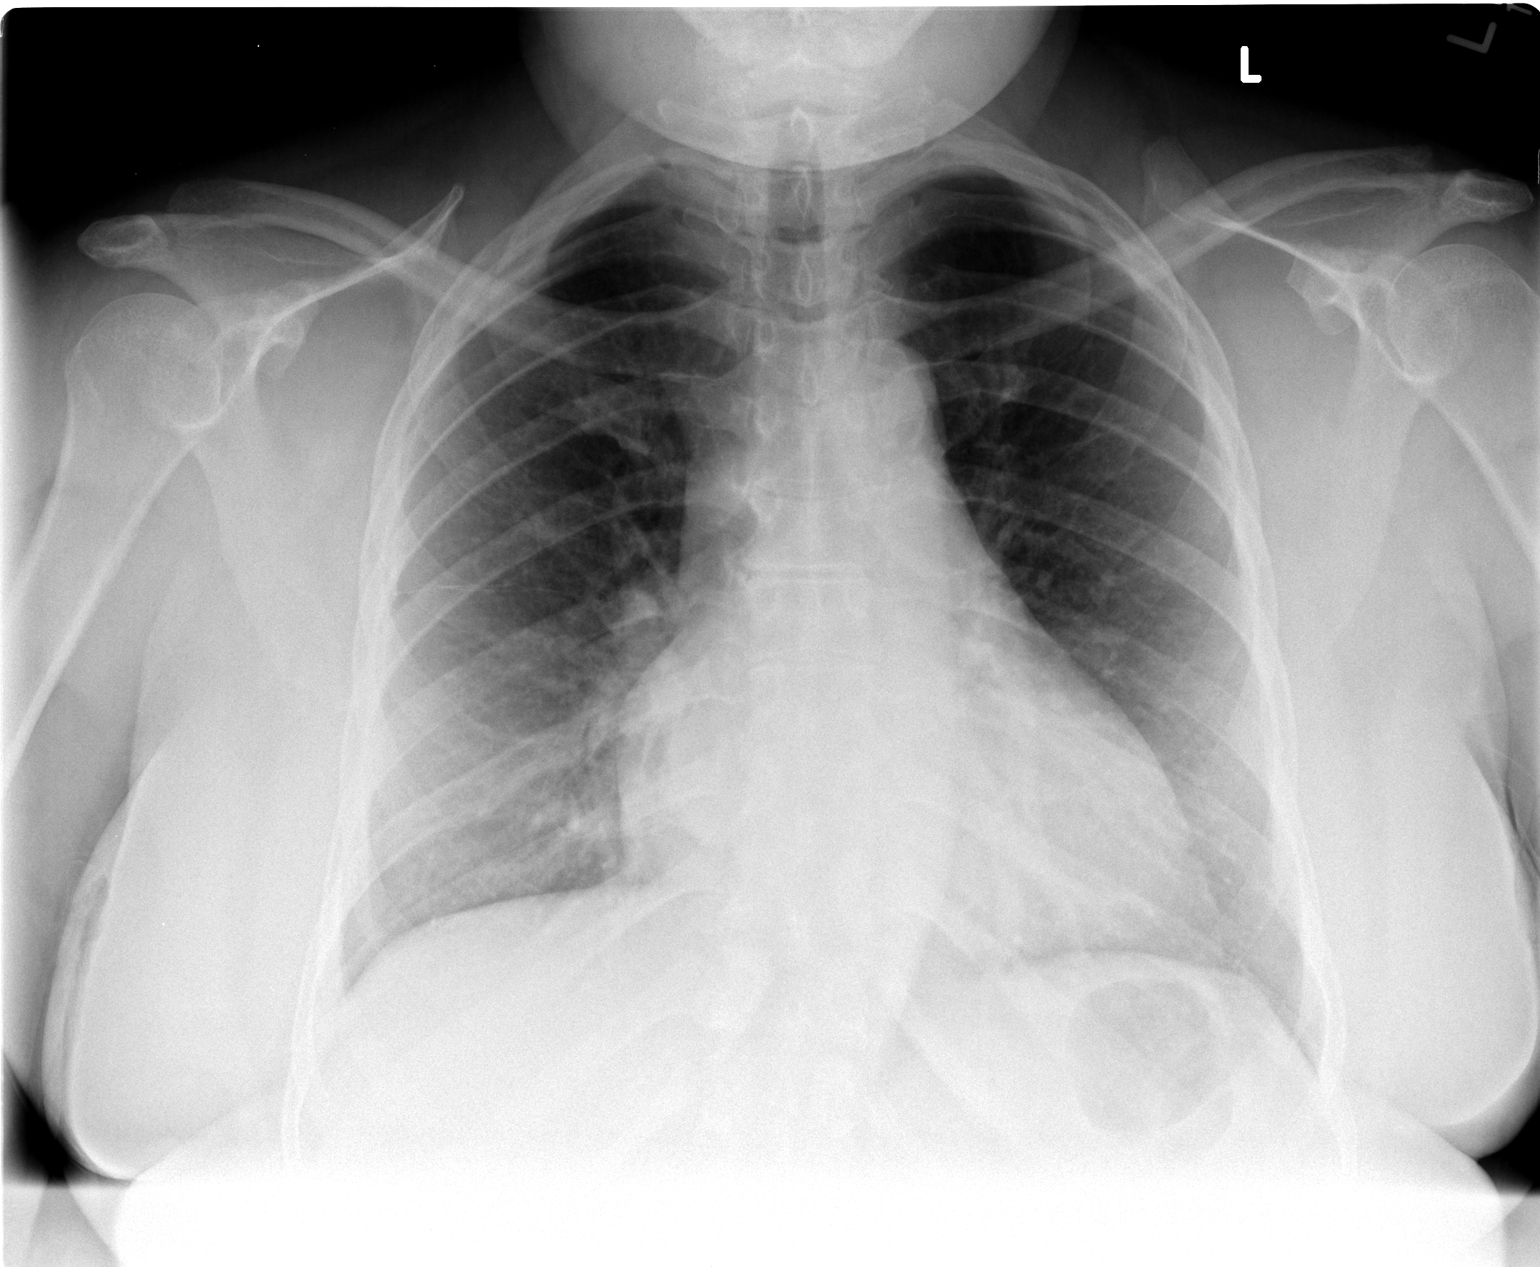

[view not recorded (2 of 2)]
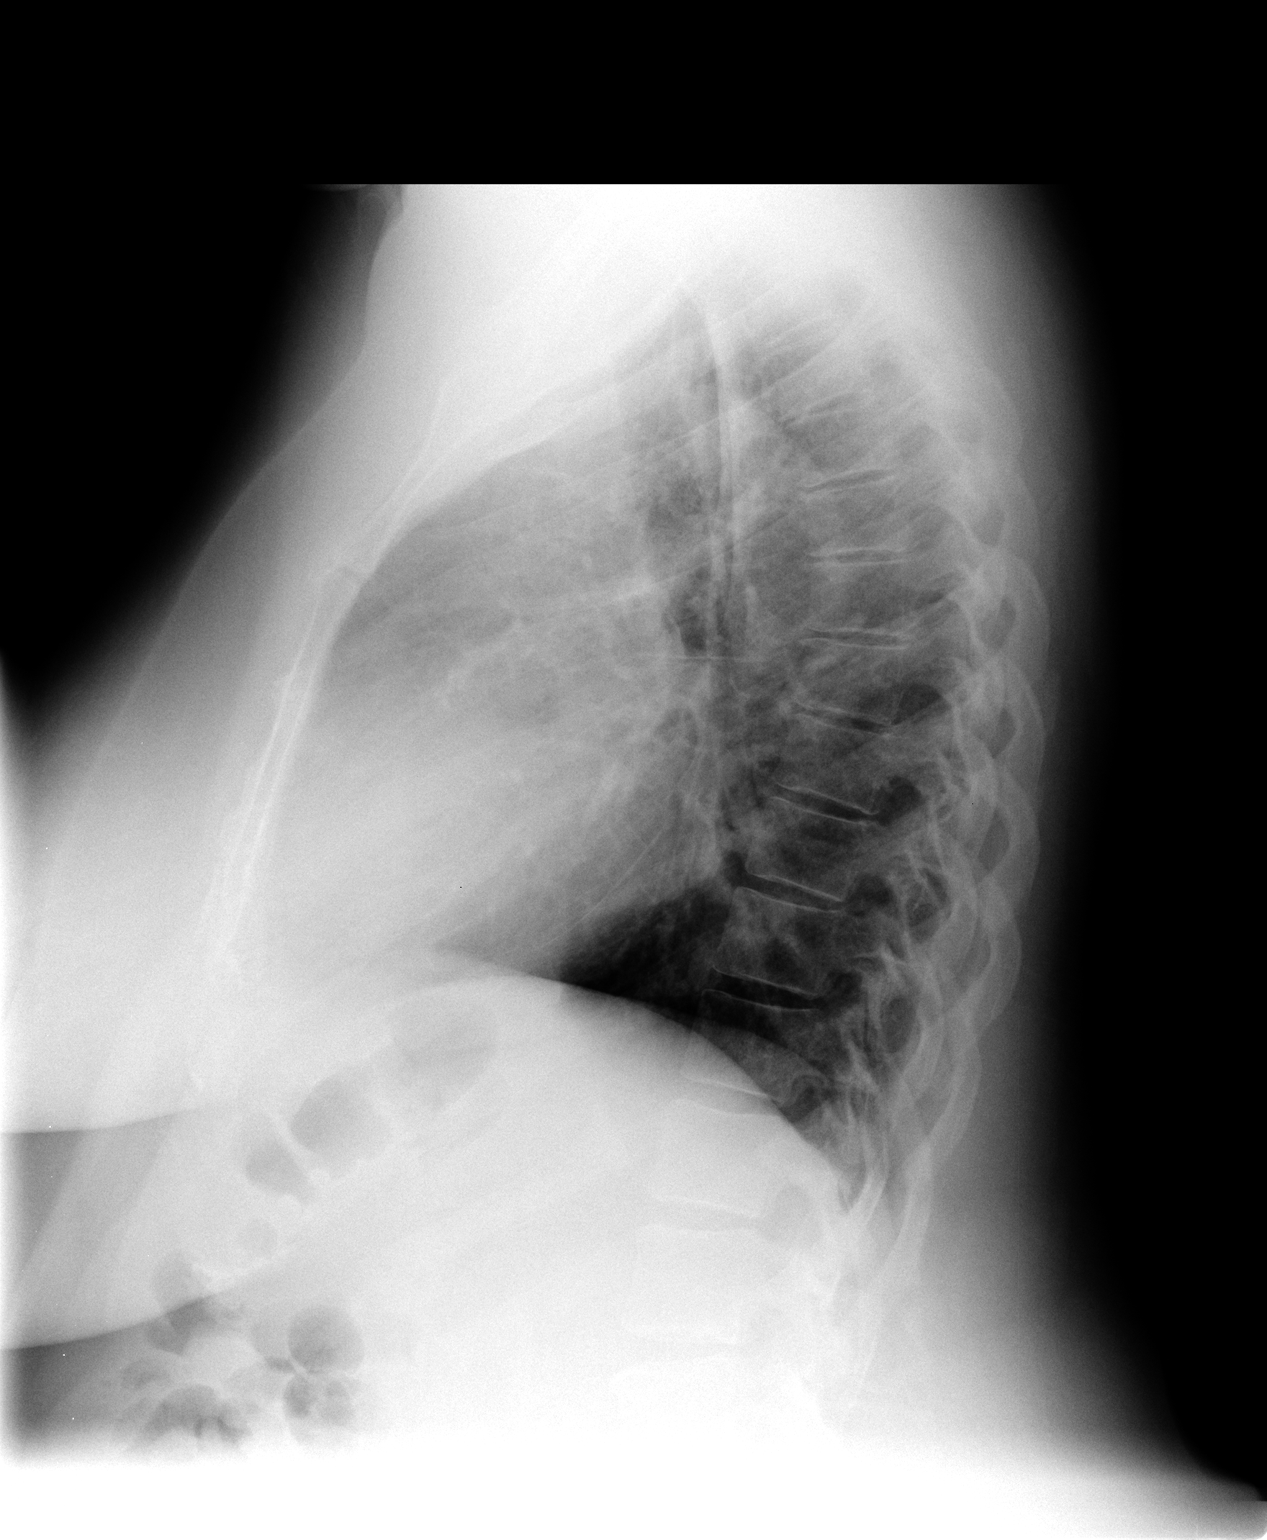

[2 of 2 positions shown; findings below may reference images not displayed]

FINDINGS: The lungs are adequately inflated. The interstitial markings remain
coarse. The cardiac silhouette is mildly enlarged. The pulmonary
vascularity is not engorged. The mediastinum is normal in width.
There is mild tortuosity of the descending thoracic aorta. There is
no pleural effusion.
IMPRESSION: Mildly increased pulmonary interstitial markings are stable and
likely reflect the patient's known underlying lung disease. There is
no acute pneumonia nor evidence of pulmonary edema.

## 2016-02-22 ENCOUNTER — Emergency Department (HOSPITAL_COMMUNITY)
Admission: EM | Admit: 2016-02-22 | Discharge: 2016-02-22 | Disposition: A | Discharge status: Home / Self Care | Payer: Medicare HMO | Attending: Emergency Medicine | Admitting: Emergency Medicine

## 2016-02-22 ENCOUNTER — Encounter (HOSPITAL_COMMUNITY): Payer: Self-pay | Admitting: *Deleted

## 2016-02-22 ENCOUNTER — Emergency Department (HOSPITAL_COMMUNITY): Payer: Medicare HMO

## 2016-02-22 DIAGNOSIS — J441 Chronic obstructive pulmonary disease with (acute) exacerbation: Secondary | ICD-10-CM | POA: Diagnosis not present

## 2016-02-22 DIAGNOSIS — Z79899 Other long term (current) drug therapy: Secondary | ICD-10-CM | POA: Insufficient documentation

## 2016-02-22 DIAGNOSIS — I2510 Atherosclerotic heart disease of native coronary artery without angina pectoris: Secondary | ICD-10-CM | POA: Diagnosis not present

## 2016-02-22 DIAGNOSIS — Z87891 Personal history of nicotine dependence: Secondary | ICD-10-CM | POA: Insufficient documentation

## 2016-02-22 DIAGNOSIS — M1990 Unspecified osteoarthritis, unspecified site: Secondary | ICD-10-CM | POA: Diagnosis not present

## 2016-02-22 DIAGNOSIS — I10 Essential (primary) hypertension: Secondary | ICD-10-CM | POA: Insufficient documentation

## 2016-02-22 DIAGNOSIS — R0602 Shortness of breath: Secondary | ICD-10-CM | POA: Diagnosis present

## 2016-02-22 DIAGNOSIS — Z7982 Long term (current) use of aspirin: Secondary | ICD-10-CM | POA: Insufficient documentation

## 2016-02-22 DIAGNOSIS — E039 Hypothyroidism, unspecified: Secondary | ICD-10-CM | POA: Insufficient documentation

## 2016-02-22 LAB — COMPREHENSIVE METABOLIC PANEL
ALT: 22 U/L (ref 14–54)
AST: 16 U/L (ref 15–41)
Albumin: 3.7 g/dL (ref 3.5–5.0)
Alkaline Phosphatase: 48 U/L (ref 38–126)
Anion gap: 7 (ref 5–15)
BILIRUBIN TOTAL: 0.4 mg/dL (ref 0.3–1.2)
BUN: 12 mg/dL (ref 6–20)
CO2: 23 mmol/L (ref 22–32)
CREATININE: 0.71 mg/dL (ref 0.44–1.00)
Calcium: 8.6 mg/dL — ABNORMAL LOW (ref 8.9–10.3)
Chloride: 108 mmol/L (ref 101–111)
Glucose, Bld: 85 mg/dL (ref 65–99)
POTASSIUM: 3.8 mmol/L (ref 3.5–5.1)
Sodium: 138 mmol/L (ref 135–145)
TOTAL PROTEIN: 6.7 g/dL (ref 6.5–8.1)

## 2016-02-22 LAB — LIPASE, BLOOD: LIPASE: 15 U/L (ref 11–51)

## 2016-02-22 LAB — CBC WITH DIFFERENTIAL/PLATELET
BASOS ABS: 0 10*3/uL (ref 0.0–0.1)
BASOS PCT: 1 %
EOS PCT: 5 %
Eosinophils Absolute: 0.3 10*3/uL (ref 0.0–0.7)
HCT: 35.5 % — ABNORMAL LOW (ref 36.0–46.0)
Hemoglobin: 11.6 g/dL — ABNORMAL LOW (ref 12.0–15.0)
Lymphocytes Relative: 44 %
Lymphs Abs: 2.4 10*3/uL (ref 0.7–4.0)
MCH: 31.4 pg (ref 26.0–34.0)
MCHC: 32.7 g/dL (ref 30.0–36.0)
MCV: 96.2 fL (ref 78.0–100.0)
MONO ABS: 0.3 10*3/uL (ref 0.1–1.0)
Monocytes Relative: 6 %
Neutro Abs: 2.3 10*3/uL (ref 1.7–7.7)
Neutrophils Relative %: 44 %
PLATELETS: 274 10*3/uL (ref 150–400)
RBC: 3.69 MIL/uL — ABNORMAL LOW (ref 3.87–5.11)
RDW: 12.3 % (ref 11.5–15.5)
WBC: 5.3 10*3/uL (ref 4.0–10.5)

## 2016-02-22 LAB — TROPONIN I

## 2016-02-22 MED ORDER — PREDNISONE 10 MG PO TABS: 40.0000 mg | ORAL_TABLET | Freq: Every day | ORAL | Status: AC

## 2016-02-22 MED ORDER — PREDNISONE 10 MG PO TABS
60.0000 mg | ORAL_TABLET | Freq: Once | ORAL | Status: AC
  Administered 2016-02-22: 60 mg via ORAL
  Filled 2016-02-22: qty 1

## 2016-02-22 MED ORDER — IPRATROPIUM-ALBUTEROL 0.5-2.5 (3) MG/3ML IN SOLN
3.0000 mL | Freq: Once | RESPIRATORY_TRACT | Status: AC
  Administered 2016-02-22: 3 mL via RESPIRATORY_TRACT
  Filled 2016-02-22: qty 3

## 2016-02-22 NOTE — Discharge Instructions (Signed)
Take the prednisone as directed. Continue usual albuterol inhaler or nebulizer. Make an appointment to follow-up with your regular doctor. Return for any new or worse symptoms.

## 2016-02-22 NOTE — ED Notes (Signed)
[  pt c/o sob, feeling like her heart is beating irregular that started a few weeks ago, was seen by pcp and was told to follow up with cardiology but pt has not been able to follow up yet,

## 2016-02-22 NOTE — ED Provider Notes (Signed)
CSN: [id number]     Arrival date & time 02/22/16  1937 History  By signing my name below, I, Alicia Peters, attest that this documentation has been prepared under the direction and in the presence of Alicia Mulders, MD. Electronically Signed: Soijett Peters, ED Scribe. 02/22/2016. 11:15 PM.   Chief Complaint  Patient presents with  . Shortness of Breath      The history is provided by the patient. No language interpreter was used.    Alicia Peters is a 60 y.o. female with a medical hx of COPD who presents to the Emergency Department complaining of  SOB onset a few weeks ago but worse in the last few days.. She notes that her SOB is worsened with walking. No chest pain no leg swelling patient feels as if this is an exacerbation of her COPD also has been having the feeling of palpitations. Patient does not use home oxygen. Patient is not on prednisone. She does use albuterol at home and Advair at home.    Past Medical History  Diagnosis Date  . Arthritis   . Essential hypertension   . BOOP (bronchiolitis obliterans with organizing pneumonia) (HCC)     VDRF 2007  . Hypothyroidism   . COPD (chronic obstructive pulmonary disease) (HCC)   . CAD (coronary artery disease)     Nonobstructive at cardiac catheterization 2007  . Bilateral breast cysts     Benign  . SLE (systemic lupus erythematosus) (HCC)    Past Surgical History  Procedure Laterality Date  . Appendectomy    . Breast surgery    . Abdominal hysterectomy    . Carpal tunnel release    . Trigger finger release     Family History  Problem Relation Age of Onset  . Stroke Father   . Heart disease Mother    Social History  Substance Use Topics  . Smoking status: Former Smoker    Types: Cigarettes    Quit date: 09/24/2004  . Smokeless tobacco: None  . Alcohol Use: No   OB History    No data available     Review of Systems  Constitutional: Negative for fever and chills.  HENT: Negative for rhinorrhea and sore  throat.   Eyes: Negative for visual disturbance.  Respiratory: Positive for shortness of breath. Negative for cough and wheezing.   Cardiovascular: Negative for chest pain.  Gastrointestinal: Positive for abdominal distention. Negative for nausea, vomiting and abdominal pain.  Genitourinary: Negative for dysuria.  Musculoskeletal: Negative for joint swelling.  Skin: Negative for rash.  Neurological: Negative for weakness, numbness and headaches.  Hematological: Does not bruise/bleed easily.  Psychiatric/Behavioral: Negative for confusion.      Allergies  Levofloxacin; Sulfonamide derivatives; Thiazide-type diuretics; Ciprofloxacin; and Sulfonylureas  Home Medications   Prior to Admission medications   Medication Sig Start Date End Date Taking? Authorizing Provider  ADVAIR DISKUS 250-50 MCG/DOSE AEPB Inhale 1 puff into the lungs 2 (two) times daily. 09/26/15  Yes Historical Provider, MD  aspirin EC 81 MG tablet Take 81 mg by mouth daily.     Yes Historical Provider, MD  carvedilol (COREG) 12.5 MG tablet Take 12.5 mg by mouth 2 (two) times daily.    Yes Historical Provider, MD  estradiol (ESTRACE) 2 MG tablet Take 2 mg by mouth daily.     Yes Historical Provider, MD  hydroxychloroquine (PLAQUENIL) 200 MG tablet Take 200 mg by mouth 2 (two) times daily.    Yes Historical Provider, MD  isosorbide mononitrate (  IMDUR) 30 MG 24 hr tablet Take 0.5 tablets (15 mg total) by mouth daily. 10/28/14  Yes Jonelle Sidle, MD  levothyroxine (SYNTHROID, LEVOTHROID) 75 MCG tablet Take 75 mcg by mouth daily.    Yes Historical Provider, MD  lovastatin (MEVACOR) 40 MG tablet Take 40 mg by mouth at bedtime.     Yes Historical Provider, MD  meloxicam (MOBIC) 15 MG tablet Take 15 mg by mouth daily.   Yes Historical Provider, MD  omeprazole (PRILOSEC) 40 MG capsule Take 40 mg by mouth daily.   Yes Historical Provider, MD  albuterol (PROVENTIL HFA;VENTOLIN HFA) 108 (90 BASE) MCG/ACT inhaler Inhale 2 puffs into  the lungs every 6 (six) hours as needed. Shortness of breath     Historical Provider, MD  doxycycline (VIBRAMYCIN) 100 MG capsule Take 1 capsule (100 mg total) by mouth 2 (two) times daily. Patient not taking: Reported on 12/29/2015 10/05/15   Erick Blinks, MD  guaiFENesin-dextromethorphan (ROBITUSSIN DM) 100-10 MG/5ML syrup Take 5 mLs by mouth every 4 (four) hours as needed for cough. Patient not taking: Reported on 12/29/2015 10/05/15   Erick Blinks, MD  predniSONE (DELTASONE) 10 MG tablet Take 40mg  po daily for 2 days then 30mg  daily for 2 days then 20mg  daily for 2 days then 10mg  daily for 2 days then stop Patient not taking: Reported on 12/29/2015 10/05/15   Erick Blinks, MD  predniSONE (DELTASONE) 10 MG tablet Take 4 tablets (40 mg total) by mouth daily. 02/22/16   Alicia Mulders, MD   BP 176/93 mmHg  Pulse 60  Temp(Src) 98.1 F (36.7 C) (Oral)  Resp 18  Ht 5\' 4"  (1.626 m)  Wt 91.173 kg  BMI 34.48 kg/m2  SpO2 96% Physical Exam  Constitutional: She is oriented to person, place, and time. She appears well-developed and well-nourished.  HENT:  Head: Normocephalic and atraumatic.  Mouth/Throat: Mucous membranes are normal.  Eyes: Conjunctivae and EOM are normal. Pupils are equal, round, and reactive to light. No scleral icterus.  Neck: Neck supple.  Cardiovascular: Normal rate, regular rhythm and normal heart sounds.  Exam reveals no gallop and no friction rub.   No murmur heard. Pulmonary/Chest: Effort normal and breath sounds normal. No respiratory distress. She has no wheezes. She has no rales.  Abdominal: Soft. Bowel sounds are normal. There is no tenderness.  Musculoskeletal: Normal range of motion. She exhibits no edema.  No swelling to ankles  Neurological: She is alert and oriented to person, place, and time. No cranial nerve deficit. She exhibits normal muscle tone. Coordination normal.  Skin: Skin is warm and dry.  Psychiatric: She has a normal mood and affect. Her behavior  is normal.  Nursing note and vitals reviewed.   ED Course  Procedures (including critical care time) DIAGNOSTIC STUDIES: Oxygen Saturation is 100% on RA, nl by my interpretation.    COORDINATION OF CARE: 11:15 PM Discussed treatment plan with pt at bedside which includes chest x-ray and nebulizer treatment    Labs Review Labs Reviewed  COMPREHENSIVE METABOLIC PANEL - Abnormal; Notable for the following:    Calcium 8.6 (*)    All other components within normal limits  CBC WITH DIFFERENTIAL/PLATELET - Abnormal; Notable for the following:    RBC 3.69 (*)    Hemoglobin 11.6 (*)    HCT 35.5 (*)    All other components within normal limits  LIPASE, BLOOD  TROPONIN I   Results for orders placed or performed during the hospital encounter of 02/22/16  Comprehensive  metabolic panel  Result Value Ref Range   Sodium 138 135 - 145 mmol/L   Potassium 3.8 3.5 - 5.1 mmol/L   Chloride 108 101 - 111 mmol/L   CO2 23 22 - 32 mmol/L   Glucose, Bld 85 65 - 99 mg/dL   BUN 12 6 - 20 mg/dL   Creatinine, Ser 2.48 0.44 - 1.00 mg/dL   Calcium 8.6 (L) 8.9 - 10.3 mg/dL   Total Protein 6.7 6.5 - 8.1 g/dL   Albumin 3.7 3.5 - 5.0 g/dL   AST 16 15 - 41 U/L   ALT 22 14 - 54 U/L   Alkaline Phosphatase 48 38 - 126 U/L   Total Bilirubin 0.4 0.3 - 1.2 mg/dL   GFR calc non Af Amer >60 >60 mL/min   GFR calc Af Amer >60 >60 mL/min   Anion gap 7 5 - 15  Lipase, blood  Result Value Ref Range   Lipase 15 11 - 51 U/L  CBC with Differential/Platelet  Result Value Ref Range   WBC 5.3 4.0 - 10.5 K/uL   RBC 3.69 (L) 3.87 - 5.11 MIL/uL   Hemoglobin 11.6 (L) 12.0 - 15.0 g/dL   HCT 25.0 (L) 03.7 - 04.8 %   MCV 96.2 78.0 - 100.0 fL   MCH 31.4 26.0 - 34.0 pg   MCHC 32.7 30.0 - 36.0 g/dL   RDW 88.9 16.9 - 45.0 %   Platelets 274 150 - 400 K/uL   Neutrophils Relative % 44 %   Neutro Abs 2.3 1.7 - 7.7 K/uL   Lymphocytes Relative 44 %   Lymphs Abs 2.4 0.7 - 4.0 K/uL   Monocytes Relative 6 %   Monocytes  Absolute 0.3 0.1 - 1.0 K/uL   Eosinophils Relative 5 %   Eosinophils Absolute 0.3 0.0 - 0.7 K/uL   Basophils Relative 1 %   Basophils Absolute 0.0 0.0 - 0.1 K/uL  Troponin I  Result Value Ref Range   Troponin I <0.03 <0.031 ng/mL     Imaging Review Dg Chest Portable 1 View  02/22/2016  CLINICAL DATA:  Chest pain. EXAM: PORTABLE CHEST 1 VIEW COMPARISON:  October 02, 2015 FINDINGS: Stable cardiomegaly. The hila and mediastinum are unchanged. Haziness over the bases consistent with overlapping soft tissues. No pneumothorax. No pulmonary nodules, masses, or infiltrates. IMPRESSION: No acute abnormalities. Electronically Signed   By: Gerome Sam III M.D   On: 02/22/2016 19:59   I have personally reviewed and evaluated these images and lab results as part of my medical decision-making.   EKG Interpretation   Date/Time:  Wednesday Feb 22 2016 19:55:04 EDT Ventricular Rate:  66 PR Interval:  182 QRS Duration: 86 QT Interval:  374 QTC Calculation: 392 R Axis:   57 Text Interpretation:  Normal sinus rhythm Cannot rule out Anterior infarct  , age undetermined ST \\T \ T wave abnormality, consider inferolateral  ischemia Abnormal ECG QT corrected No significant change since last  tracing Confirmed by Larrie Fraizer  MD, Robert Sperl [phone]) on 02/22/2016 8:10:28 PM      MDM   Final diagnoses:  SOB (shortness of breath)  COPD exacerbation (HCC)    Patient with extensive workup without a good explanation for the shortness of breath. The patient feels like it's an exacerbation of her COPD. Patient with cardiac monitoring no signs of any arrhythmia. Patient at rest sats were 96% or higher. No tachycardia. However patient is on Coreg. Do not feel that this is consistent with a pulmonary embolus.  Patient the like there was tightness in the throat there was no chest pain. No leg swelling. Will go ahead and start a course of steroids to see if this helps she will continue to use her albuterol inhalers.  She'll return for any new or worse symptoms. Patient's troponin was negative white count not elevated no significant anemia no electrolyte or liver function test abnormalities. Patient also felt as if there was fullness in her abdomen area but on exam not sniffily distended nontender. Patient received nebulizer treatment here she didn't think that made much difference. Symptoms all started a few weeks ago but got more significant in the last few days.  I personally performed the services described in this documentation, which was scribed in my presence. The recorded information has been reviewed and is accurate.      Alicia Mulders, MD 02/22/16 [phone]

## 2016-02-22 NOTE — ED Notes (Signed)
Ambulated patient around nurses station 2x with pulse ox. Patient sated at 91% while walking.

## 2016-06-24 DEATH — deceased

## 2017-06-05 ENCOUNTER — Telehealth: Payer: Self-pay | Admitting: Internal Medicine

## 2017-06-05 NOTE — Telephone Encounter (Signed)
Letter mailed to pt.  

## 2017-06-05 NOTE — Telephone Encounter (Signed)
RECALL FOR TCS °
